# Patient Record
Sex: Female | Born: 1955
Health system: Southern US, Community
[De-identification: ages and names within clinical notes are randomized; demographics above are authoritative.]

## PROBLEM LIST (undated history)

## (undated) DIAGNOSIS — R112 Nausea with vomiting, unspecified: Secondary | ICD-10-CM

## (undated) DIAGNOSIS — F419 Anxiety disorder, unspecified: Secondary | ICD-10-CM

## (undated) DIAGNOSIS — T4145XA Adverse effect of unspecified anesthetic, initial encounter: Secondary | ICD-10-CM

## (undated) DIAGNOSIS — Z9889 Other specified postprocedural states: Secondary | ICD-10-CM

## (undated) DIAGNOSIS — M1990 Unspecified osteoarthritis, unspecified site: Secondary | ICD-10-CM

## (undated) DIAGNOSIS — K219 Gastro-esophageal reflux disease without esophagitis: Secondary | ICD-10-CM

## (undated) DIAGNOSIS — N809 Endometriosis, unspecified: Secondary | ICD-10-CM

## (undated) DIAGNOSIS — T8859XA Other complications of anesthesia, initial encounter: Secondary | ICD-10-CM

## (undated) DIAGNOSIS — I10 Essential (primary) hypertension: Secondary | ICD-10-CM

## (undated) DIAGNOSIS — I447 Left bundle-branch block, unspecified: Secondary | ICD-10-CM

## (undated) DIAGNOSIS — G709 Myoneural disorder, unspecified: Secondary | ICD-10-CM

## (undated) DIAGNOSIS — I498 Other specified cardiac arrhythmias: Secondary | ICD-10-CM

## (undated) HISTORY — DX: Essential (primary) hypertension: I10

## (undated) HISTORY — PX: UPPER GI ENDOSCOPY: SHX6162

## (undated) HISTORY — PX: LAPAROSCOPY: SHX197

## (undated) HISTORY — PX: ERCP: SHX60

## (undated) HISTORY — PX: DILATION AND CURETTAGE OF UTERUS: SHX78

## (undated) HISTORY — DX: Gastro-esophageal reflux disease without esophagitis: K21.9

## (undated) HISTORY — PX: EYE SURGERY: SHX253

## (undated) HISTORY — DX: Endometriosis, unspecified: N80.9

## (undated) HISTORY — PX: COLONOSCOPY: SHX174

---

## 2000-03-22 ENCOUNTER — Other Ambulatory Visit: Admission: RE | Admit: 2000-03-22 | Discharge: 2000-03-22 | Payer: Self-pay | Admitting: Family Medicine

## 2000-06-14 ENCOUNTER — Encounter: Admission: RE | Admit: 2000-06-14 | Discharge: 2000-06-14 | Payer: Self-pay | Admitting: Family Medicine

## 2000-06-14 ENCOUNTER — Encounter: Payer: Self-pay | Admitting: Family Medicine

## 2001-11-17 ENCOUNTER — Other Ambulatory Visit: Admission: RE | Admit: 2001-11-17 | Discharge: 2001-11-17 | Payer: Self-pay | Admitting: Family Medicine

## 2002-09-25 ENCOUNTER — Encounter: Payer: Self-pay | Admitting: Family Medicine

## 2002-09-25 ENCOUNTER — Encounter: Admission: RE | Admit: 2002-09-25 | Discharge: 2002-09-25 | Payer: Self-pay | Admitting: Family Medicine

## 2003-01-14 ENCOUNTER — Other Ambulatory Visit: Admission: RE | Admit: 2003-01-14 | Discharge: 2003-01-14 | Payer: Self-pay | Admitting: Family Medicine

## 2003-12-16 ENCOUNTER — Encounter: Admission: RE | Admit: 2003-12-16 | Discharge: 2003-12-16 | Payer: Self-pay | Admitting: Family Medicine

## 2003-12-22 ENCOUNTER — Encounter: Admission: RE | Admit: 2003-12-22 | Discharge: 2003-12-22 | Payer: Self-pay | Admitting: Family Medicine

## 2004-03-28 ENCOUNTER — Other Ambulatory Visit: Admission: RE | Admit: 2004-03-28 | Discharge: 2004-03-28 | Payer: Self-pay | Admitting: Family Medicine

## 2004-05-12 ENCOUNTER — Ambulatory Visit (HOSPITAL_COMMUNITY): Admission: RE | Admit: 2004-05-12 | Discharge: 2004-05-12 | Payer: Self-pay | Admitting: Gastroenterology

## 2004-05-12 ENCOUNTER — Encounter (INDEPENDENT_AMBULATORY_CARE_PROVIDER_SITE_OTHER): Payer: Self-pay | Admitting: Specialist

## 2004-06-07 ENCOUNTER — Encounter: Admission: RE | Admit: 2004-06-07 | Discharge: 2004-06-07 | Payer: Self-pay | Admitting: Family Medicine

## 2004-06-27 ENCOUNTER — Ambulatory Visit: Payer: Self-pay | Admitting: Gastroenterology

## 2004-06-29 ENCOUNTER — Ambulatory Visit: Payer: Self-pay | Admitting: Gastroenterology

## 2004-07-04 ENCOUNTER — Ambulatory Visit: Payer: Self-pay | Admitting: Gastroenterology

## 2004-07-05 ENCOUNTER — Encounter: Admission: RE | Admit: 2004-07-05 | Discharge: 2004-07-05 | Payer: Self-pay | Admitting: Gastroenterology

## 2004-07-20 ENCOUNTER — Ambulatory Visit (HOSPITAL_COMMUNITY): Admission: RE | Admit: 2004-07-20 | Discharge: 2004-07-20 | Payer: Self-pay | Admitting: Gastroenterology

## 2004-08-21 ENCOUNTER — Encounter: Admission: RE | Admit: 2004-08-21 | Discharge: 2004-08-21 | Payer: Self-pay | Admitting: Surgery

## 2004-09-19 ENCOUNTER — Ambulatory Visit: Payer: Self-pay | Admitting: Internal Medicine

## 2004-09-19 ENCOUNTER — Inpatient Hospital Stay (HOSPITAL_COMMUNITY): Admission: AD | Admit: 2004-09-19 | Discharge: 2004-09-23 | Payer: Self-pay | Admitting: Urology

## 2004-09-22 ENCOUNTER — Encounter (INDEPENDENT_AMBULATORY_CARE_PROVIDER_SITE_OTHER): Payer: Self-pay | Admitting: *Deleted

## 2004-10-06 ENCOUNTER — Ambulatory Visit (HOSPITAL_COMMUNITY): Admission: RE | Admit: 2004-10-06 | Discharge: 2004-10-06 | Payer: Self-pay | Admitting: Internal Medicine

## 2004-10-18 ENCOUNTER — Ambulatory Visit: Payer: Self-pay | Admitting: Internal Medicine

## 2004-10-26 ENCOUNTER — Ambulatory Visit (HOSPITAL_COMMUNITY): Admission: RE | Admit: 2004-10-26 | Discharge: 2004-10-26 | Payer: Self-pay | Admitting: Internal Medicine

## 2004-11-05 ENCOUNTER — Ambulatory Visit: Payer: Self-pay | Admitting: Internal Medicine

## 2004-11-21 ENCOUNTER — Ambulatory Visit: Payer: Self-pay | Admitting: Internal Medicine

## 2005-01-09 ENCOUNTER — Encounter: Admission: RE | Admit: 2005-01-09 | Discharge: 2005-01-09 | Payer: Self-pay | Admitting: Family Medicine

## 2005-03-29 ENCOUNTER — Other Ambulatory Visit: Admission: RE | Admit: 2005-03-29 | Discharge: 2005-03-29 | Payer: Self-pay | Admitting: Family Medicine

## 2006-02-08 ENCOUNTER — Ambulatory Visit (HOSPITAL_COMMUNITY): Admission: RE | Admit: 2006-02-08 | Discharge: 2006-02-08 | Payer: Self-pay | Admitting: Obstetrics and Gynecology

## 2006-03-25 ENCOUNTER — Encounter: Admission: RE | Admit: 2006-03-25 | Discharge: 2006-03-25 | Payer: Self-pay | Admitting: Family Medicine

## 2006-03-27 ENCOUNTER — Encounter: Admission: RE | Admit: 2006-03-27 | Discharge: 2006-03-27 | Payer: Self-pay | Admitting: Family Medicine

## 2006-04-03 ENCOUNTER — Other Ambulatory Visit: Admission: RE | Admit: 2006-04-03 | Discharge: 2006-04-03 | Payer: Self-pay | Admitting: Family Medicine

## 2006-04-19 ENCOUNTER — Encounter: Admission: RE | Admit: 2006-04-19 | Discharge: 2006-04-19 | Payer: Self-pay | Admitting: Family Medicine

## 2006-05-22 ENCOUNTER — Encounter: Admission: RE | Admit: 2006-05-22 | Discharge: 2006-05-22 | Payer: Self-pay | Admitting: Family Medicine

## 2006-09-19 ENCOUNTER — Encounter: Admission: RE | Admit: 2006-09-19 | Discharge: 2006-09-19 | Payer: Self-pay | Admitting: Obstetrics and Gynecology

## 2006-11-25 ENCOUNTER — Encounter: Admission: RE | Admit: 2006-11-25 | Discharge: 2006-11-25 | Payer: Self-pay | Admitting: Obstetrics and Gynecology

## 2006-12-19 ENCOUNTER — Encounter: Admission: RE | Admit: 2006-12-19 | Discharge: 2006-12-19 | Payer: Self-pay | Admitting: Obstetrics and Gynecology

## 2007-01-17 ENCOUNTER — Ambulatory Visit (HOSPITAL_COMMUNITY): Admission: RE | Admit: 2007-01-17 | Discharge: 2007-01-17 | Payer: Self-pay | Admitting: Obstetrics and Gynecology

## 2007-04-18 ENCOUNTER — Other Ambulatory Visit: Admission: RE | Admit: 2007-04-18 | Discharge: 2007-04-18 | Payer: Self-pay | Admitting: Family Medicine

## 2007-05-14 ENCOUNTER — Encounter: Admission: RE | Admit: 2007-05-14 | Discharge: 2007-05-14 | Payer: Self-pay | Admitting: Otolaryngology

## 2007-08-05 ENCOUNTER — Ambulatory Visit (HOSPITAL_COMMUNITY): Admission: RE | Admit: 2007-08-05 | Discharge: 2007-08-05 | Payer: Self-pay | Admitting: Unknown Physician Specialty

## 2008-04-21 ENCOUNTER — Other Ambulatory Visit: Admission: RE | Admit: 2008-04-21 | Discharge: 2008-04-21 | Payer: Self-pay | Admitting: Family Medicine

## 2008-05-05 ENCOUNTER — Encounter: Admission: RE | Admit: 2008-05-05 | Discharge: 2008-05-05 | Payer: Self-pay | Admitting: Family Medicine

## 2008-05-12 ENCOUNTER — Encounter: Admission: RE | Admit: 2008-05-12 | Discharge: 2008-05-12 | Payer: Self-pay | Admitting: Unknown Physician Specialty

## 2008-05-26 ENCOUNTER — Encounter: Admission: RE | Admit: 2008-05-26 | Discharge: 2008-05-26 | Payer: Self-pay | Admitting: Unknown Physician Specialty

## 2008-06-07 ENCOUNTER — Encounter: Admission: RE | Admit: 2008-06-07 | Discharge: 2008-06-07 | Payer: Self-pay | Admitting: Obstetrics and Gynecology

## 2008-07-22 ENCOUNTER — Encounter: Admission: RE | Admit: 2008-07-22 | Discharge: 2008-07-22 | Payer: Self-pay | Admitting: Family Medicine

## 2009-05-23 ENCOUNTER — Other Ambulatory Visit: Admission: RE | Admit: 2009-05-23 | Discharge: 2009-05-23 | Payer: Self-pay | Admitting: Family Medicine

## 2009-09-15 ENCOUNTER — Encounter: Admission: RE | Admit: 2009-09-15 | Discharge: 2009-09-15 | Payer: Self-pay | Admitting: Obstetrics and Gynecology

## 2009-09-21 ENCOUNTER — Encounter: Admission: RE | Admit: 2009-09-21 | Discharge: 2009-09-21 | Payer: Self-pay | Admitting: Obstetrics and Gynecology

## 2010-02-05 ENCOUNTER — Encounter: Payer: Self-pay | Admitting: Gastroenterology

## 2010-02-05 ENCOUNTER — Encounter: Payer: Self-pay | Admitting: Obstetrics and Gynecology

## 2010-02-05 ENCOUNTER — Encounter: Payer: Self-pay | Admitting: Family Medicine

## 2010-06-02 NOTE — Op Note (Signed)
NAMEZAYLEY, Amanda Bentley            ACCOUNT NO.:  [id number]   MEDICAL RECORD NO.:  [id number]          PATIENT TYPE:  AMB   LOCATION:  ENDO                         FACILITY:  [facility]   PHYSICIAN:  Graylin Shiver, M.D.   DATE OF BIRTH:  June 25, 1955   DATE OF PROCEDURE:  05/12/2004  DATE OF DISCHARGE:                                 OPERATIVE REPORT   PROCEDURE:  Esophagogastroduodenoscopy with biopsy.   INDICATIONS FOR PROCEDURE:  A 55 year old female with complaints of burning  in her throat and chest over the past month. Endoscopy is being done to  evaluate.   Informed consent was obtained after explanation of the risks of bleeding,  infection and perforation.   PREMEDICATION:  Fentanyl 75 mcg IV, Versed 7 mg IV.   DESCRIPTION OF PROCEDURE:  With the patient in the left lateral decubitus  position, the Olympus gastroscope was inserted into the oropharynx and  passed into the esophagus. It was advanced down the esophagus and into the  stomach and into the duodenum. The second portion and bulb of the duodenum  looked normal. The stomach showed a diffuse erythematous appearance to the  mucosa compatible with gastritis. Biopsies were obtained for histology and  to look for evidence of Helicobacter pylori. In the lower body of the  stomach on the greater curvature side, there was a small 4 mm polyp that was  biopsied off with cold forceps. No specific lesions were seen in the fundus  or cardia. The esophagus revealed the esophagogastric junction to be at 40  cm, it looked normal. The esophageal mucosa looked normal in its entirety.  She tolerated the procedure well without complications.   IMPRESSION:  1.  Gastritis.  2.  Small gastric polyp.   PLAN:  The biopsies will be checked. The patient will remain on a proton  pump inhibitor.      SFG/MEDQ  D:  05/12/2004  T:  05/12/2004  Job:  [id number]   cc:   Juluis Mire, M.D.  [address] [place]  [place] [address]  Fax: [phone]   Quita Skye. Artis Flock, M.D.  [address], [address]  [place]  [place] [address]  Fax: [phone]

## 2010-06-02 NOTE — H&P (Signed)
NAMEJORDANN, Amanda Bentley            ACCOUNT NO.:  [id number]   MEDICAL RECORD NO.:  [id number]          PATIENT TYPE:  INP   LOCATION:  6715                         FACILITY:  [facility]   PHYSICIAN:  Jamison Neighbor, M.D.  DATE OF BIRTH:  Mar 24, 1955   DATE OF ADMISSION:  09/19/2004  DATE OF DISCHARGE:                                HISTORY & PHYSICAL   ADMITTING DIAGNOSIS:  Abdominal pain of undetermined etiology.   HISTORY:  This is a 55 year old female who has severe upper gastrointestinal  abdominal pain that has been unresponsive to conventional medical therapy.  The patient was first seen and evaluated by Dr. Evette Cristal from [facility], who saw  her in April 2006.  He obtained a CT scan of the abdomen that was felt to be  unremarkable.  It should be noted this was done without IV contrast due to  allergies.  There was some question about a possibly enlarged liver some  gallbladder sludge.  Endoscopy was performed on May 04, 2004.  There were  gastric biopsies showed no evidence of H. pylori.  There was a question of a  small gastric polyp, and some question of gastritis.  The patient did have a  proton pump inhibitor started.  The patient did not improve, and Dr. Evette Cristal  felt there was no much more that he could do.  The patient has been seen by  Dr. Cicero Duck, who ordered a hepatobiliary scan, which showed normal  gallbladder ejection fraction.  He did, at one point, offer to do a  cholecystectomy with no guarantee that there would be any improvement.  The  patient did not wish to have this done.  She was subsequently seen by Dr.  Sheryn Bison as a self referral.  At that point, the patient noted that  her pain had been going on for many months.  There has also been some  associated anxiety and panic attacks that were worsened by the death of her  sister over 9 months ago from gastric carcinoma.  The patient has had  burning in the retropharyngeal area with crampy lower abdominal  pain and  diarrhea.  She has not tolerated proton pump inhibitors, but states they  have not worked, and she now has a cough and burning pain in her chest that  radiates into both shoulders.  The patient has had significant weight loss;  she now says almost 40 pounds.  The patient has had regular bowel movements.  She has had no blood per rectum.  She has had no change in the color of her  stools.  There has been no evidence of jaundice.  The patient says that she  does feel like her food is not being absorbed well.  She says she is eating,  but at other points she does note that she has significant issues with  nausea.   PAST MEDICAL HISTORY:  1.  Remarkable for hypertension.  2.  She has had a laparoscopy in 2000, and has been treated in the past for      endometriosis.   MEDICATIONS AT THE TIME OF  ADMISSION:  1.  Clonazepam 0.5 mg b.i.d. which was started by Dr. Jarold Motto.  2.  AcipHex 20 mg daily, which is the only medication for reflux that she      seems to tolerate.  3.  Birth control pills.  4.  Potassium that she took because she did have a slightly low potassium in      the past.  5.  5-HTP dietary supplement.   ALLERGIES:  1.  The patient is known to have a history of IVP dye allergy.  2.  As noted above, has had problems with different proton pump inhibitors.   FAMILY HISTORY:  Remarkable for a sister who died of stomach cancer.  Father  had alcoholism.  Her mother died of lung cancer.   SOCIAL HISTORY:  Unremarkable.  She does not use tobacco or alcohol.  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   REVIEW OF SYSTEMS:  Unremarkable, with the exception of the fact that the  patient does have occasional palpitations.  In the past, she has had a  diagnosis of Wolff-Parkinson-White, but is not seen by a cardiologist.  The  patient had no other ongoing cardiac, pulmonary, musculoskeletal,  neurologic, endocrine, vascular,  gastrointestinal, dermatologic, urologic,  gynecologic, or psychologic problems, other than as described above.   PHYSICAL EXAMINATION:  GENERAL:  She is a well-developed, well-nourished  female.  She is slightly anxious, and has a great deal of upper abdominal  distress.  VITAL SIGNS:  Temperature is 98.4, pulse 70, respirations 17, blood pressure  128/85.  HEENT:  Normocephalic and atraumatic.  Cranial nerves II-XII appeared  grossly intact.  NECK:  Supple.  No adenopathy or thyromegaly.  LUNGS:  Clear.  HEART:  Regular rate and rhythm with no murmurs, thrills, gallops, rubs, or  heaves.  ABDOMEN:  Soft with no hepatosplenomegaly.  Her tenderness was all across  the upper abdomen.  It does seem to reflux up into the epigastric area.  Bowel sounds are normal.  The peripheral pulses are unremarkable.  NEUROLOGIC:  Appear to be intact.  PELVIC:  The patient did not undergo a pelvic examination by me, as she has  had this done fairly recently by Dr. Arelia Sneddon.  She has also had a recent  possible sigmoidoscopy and Hemoccult  by Dr. Luanna Salk.   I am concerned that this patient has had very extensive GI evaluation so  far, but it has been generally negative.  She is very concerned about the  fact that she did have a slight increase in her liver function.  She is  losing weight at a precipitous rate.  She is highly anxious, and has not  responded to standard forms of antidepressants, such as Paxil.  I think that  she needs to be admitted for a complete evaluation to determine once and for  all if there is anything significant that can help this patient.  I have  recommended that she be brought into the hospital and undergo evaluation by  both GI and general surgery, and will keep her in the hospital until that  evaluation can be completed.  Studies that I think will need to be  considered by the GI consultants will include a repeat ultrasound, possible hepatobiliary scan, possible CT scan,  as well as repeat of her upper GI  endoscopy.  Also, if there is any suggestion that she may really indeed have  reflux, manometry would be worthwhile.  At this  point, I am not clear if she  has any real lower GI symptoms, but if there is evidence, she should  probably undergo a colonoscopy, as she is now 55 years old and has never  undergone a procedure of that nature.  This would essentially rule out lower  GI condition such as Crohn's disease or ulcerative colitis.  I also believe  the patient should undergo screening for celiac sprue.  I have told the  patient that if her GI evaluation is completely negative, and both general  surgery and GI do not feel that there is any value to any additional  testing, I would recommend  that she undergo counseling for anxiety to  determine if anything could be done to help her function a little better.  Dietary consultation will be sought during this hospital stay.           ______________________________  Jamison Neighbor, M.D.  Electronically Signed     RJE/MEDQ  D:  09/20/2004  T:  09/21/2004  Job:  [id number]

## 2010-06-02 NOTE — Consult Note (Signed)
Amanda Bentley, Amanda Bentley            ACCOUNT NO.:  [id number]   MEDICAL RECORD NO.:  [id number]          PATIENT TYPE:  INP   LOCATION:  6715                         FACILITY:  [facility]   PHYSICIAN:  Iva Boop, M.D. LHCDATE OF BIRTH:  Dec 04, 1955   DATE OF CONSULTATION:  09/20/2004  DATE OF DISCHARGE:                                   CONSULTATION   REQUESTING PHYSICIAN:  Dr. Marcelyn Bruins   REASON FOR CONSULTATION:  Abdominal pain, chest pain.   ASSESSMENT:  Chronic chest burning symptoms associated with some variable  dysphagia and a globus sensation in the past. She now has abdominal pain in  multiple sites but is tender in the epigastrium. There is a 35-pound weight  loss. Multiple studies have been negative for a gastrointestinal etiology  and for any source of her pain. This is in the setting of her sister dying  from gastric cancer about 8 months ago. These problems started after an  acute onset of what sounds like epigastric and periumbilical abdominal pain  that she says was like a stomach bug. Then things seem to spread up into  her chest with burning that is persistent. She has been to ENT, she has been  to two different gastroenterologists Evette Cristal and Jarold Motto), she has had an  endoscopy, she has had CT of the chest, CT of the abdomen and pelvis, pH  probe - all of which have been unrevealing. Gallbladder HIDA scan with  ejection fraction and ultrasound have been unrevealing except for sludge in  the gallbladder. She has had some minor transaminase elevation without  etiology. Her sed rate and her C reactive protein have been normal. She has  not been anemic. She has green stools but no real significant change in her  bowel habits. She does have some panic-attack-like symptoms in this period  of time since this started in April. She has not really responded to proton  pump inhibitors but has had some benefit on AcipHex. However, she indicates  that AcipHex and Nexium have  given her headaches and multiple joint pains  and somatic complaints and perhaps even some paresthesias, though that is  not entirely clear. She does not appear overtly depressed. She has been  using some Klonopin, which helps at times. In May 2006, she did have an  enlarged liver seen on an unenhanced CT scan.   DISCUSSION:  I think she could potentially have had some type of neuropathic  problem triggered by gastroenteritis, though I doubt it. Sprue is possible  with weight loss, arthralgias, and abnormal LFTs. Given her headaches and  multiple somatic complaints, multiple sclerosis comes to mind as a  possibility; though that is pretty unlikely, I think it is quite possible. A  somatization problem related to mental illness is possible. I could not  palpate her liver on exam, though I think that it is certainly possible she  could have hepatomegaly problems based upon the CT, and this should be  followed up on.   PLAN:  1.  Check barium swallow.  2.  Check Sprue antibodies. A Sprue panel will be checked.  3.  Consider a low-dose tricyclic agent though she is reluctant, pending      this workup.  4.  I think an MRI of the brain would be reasonable.  5.  MRI of the liver versus CT of the abdomen and pelvis with IV contrast. I      do not think she has a contrast allergy, but we will check. Note that      ultrasound on June 30, 2004 did not show any problems as far as      gallstones, and the liver looked normal without hepatomegaly.   Upon further review plan changed to CT abd/pelvis with subsequent EGD.   HISTORY:  Pretty much as above. This lady has had significant problems with  chest burning. Everything she eats burns, but she burns all the time from  morning to night. She will occasionally use Klonopin at night. She is having  some epigastric pain and tenderness there. Bowel movements are green. She  has multiple joint pains and aches and burning in parts of her body at   times, it sounds like, but she attributes this to either AcipHex or Nexium.  The AcipHex helps her symptoms about 50%. She is eating less because of her  problems. She does not really have nausea and vomiting and there is no GI  bleeding. She has had numerous studies as outlined above, including the  abdominal ultrasound, CT of the chest (without contrast), HIDA scan, acute  abdominal series, CT of the abdomen without contrast, and the EGD by Dr.  Evette Cristal in April that showed gastritis, CLO biopsy negative, and a small  benign gastric polyp. There were reactive changes, question related to  NSAIDs versus alcohol, though I do not think there is any significant  alcohol history. She has had some intermittent mild abnormalities of her  LFTs with SGPT of 74 this admission, at times they have been higher, and she  had SGOT of 46, SGPT of 131 on July 04, 2004 when Dr. Jarold Motto saw her. He  had requested she do some other studies but I do not think those were ever  really done. She has had a negative sed rate and C reactive protein. She has  had a lot of globus problems and vague dysphagia. She does have panic  symptoms at times with tachycardia and chest pain. She took some Effexor and  said she had terrible chest pain that awakened her from sleep and she had to  go to the fire station for help, and was hypertensive - all after one dose.   PAST MEDICAL HISTORY:  1.  As above.  2.  Hypertension. Has been off her blood pressure medicines.  3.  Laparoscopic surgery for endometriosis in the past.   MEDICATIONS ON ADMISSION:  Percocet, Phenergan, Ambien, Xanax - all p.r.n.   SOCIAL HISTORY:  High-school education. She works at the [employer]. No tobacco or alcohol. She is married.   FAMILY HISTORY:  As above, as far as sister dying with gastric cancer.  REVIEW OF SYSTEMS:  No urinary symptoms, no bleeding problems. She has had  some loss of concentration. She has not had  hypersomnolence or significant  insomnia as far as I can tell. She has clearly been preoccupied with these  problems. All other systems appear negative other than those things  mentioned above.   PHYSICAL EXAMINATION:  GENERAL:  Reveals a pleasant, middle-aged white woman  in no acute distress.  VITAL SIGNS:  Show pulse 80 and regular, temperature 98.1, respirations 18,  blood pressure 144/82, weight 143 pounds.  HEENT:  Eyes anicteric. Mouth and posterior pharynx free of lesions. Cranial  nerves II-XII are intact.  NECK:  Supple. No thyromegaly or mass, no lymphadenopathy of the neck or  supraclavicular regions or inguinal regions.  CHEST:  Clear.  HEART:  S1, S2. No murmurs, or gallops. Chest wall nontender.  ABDOMEN:  There is mild epigastric tenderness that seems improved with  muscle wall tension. There is no organomegaly or mass or tenderness in the  abdomen.  EXTREMITIES:  No edema in the extremities.  SKIN:  There does not appear to be skin rash.  MENTAL STATUS:  She is appropriate, perhaps slightly anxious, but has as  normal affect.  NEUROLOGIC:  No tremor, otherwise nonfocal.   LABORATORY DATA:  As above as far as LFTs, they are otherwise normal.  Albumin 3.8 which normal. BMET normal. White count 10.4, hemoglobin 13.6,  MCV 91, platelets 298. Amylase 62.   I appreciate the opportunity to care for this patient.      Iva Boop, M.D. [facility]  Electronically Signed     CEG/MEDQ  D:  09/20/2004  T:  09/21/2004  Job:  [id number]   cc:   Nicholes Rough, [place] Scheryl Darter, PA-C   Currie Paris, M.D.  [address], [address]  [place]  [place] [address]

## 2010-06-02 NOTE — Consult Note (Signed)
NAMEERLA, BACCHI            ACCOUNT NO.:  [id number]   MEDICAL RECORD NO.:  [id number]          PATIENT TYPE:  INP   LOCATION:  6715                         FACILITY:  [facility]   PHYSICIAN:  Guy Franco, P.A.       DATE OF BIRTH:  Jan 05, 1956   DATE OF CONSULTATION:  09/21/2004  DATE OF DISCHARGE:                                   CONSULTATION   REASON FOR CONSULTATION:  Abdominal pain and weight loss.   HISTORY OF PRESENT ILLNESS:  Ms. Cronce is a 55 year old female who  complains of a 35 pound weight loss since March 2006.  She has also had  multiple GI symptoms including reflux, chest burning, throat burning.  She  has tried multiple PPIs and states that AcipHex has helped the most.  Her  abdominal pain, when asking her to describe the location, seems to be more  epigastric in nature.  She did undergo an EGD in April 2006 by Dr. Evette Cristal and  this showed gastritis as well as a small gastric polyp which was apparently  benign.  A CT of the abdomen in May 2006 was not acute.  There was a  question of sludge in the gallbladder.  A follow up abdominal ultrasound was  normal.  A HIDA scan was normal.  The other complaint that she has is green  stool.  She has apparently seen Dr. Jamey Ripa in our office and essentially  wants her gallbladder out but he has explained to her that this may not take  care of all her symptoms.  Her other complaint is that she feels like food  gets stuck in her throat.  Labs today show SGPT 74.  Amylase, urinalysis,  CBC, and CMP are normal.  Abdominal films of the abdomen are normal.   PAST MEDICAL HISTORY:  Hypertension, anxiety, history of endometriosis  (diagnosed laparoscopically), and depression.   SOCIAL HISTORY:  She works at [employer] and lives in [place], no  tobacco or alcohol use.   FAMILY HISTORY:  Her sister died of stomach cancer approximately one year  ago.  Her mom died at 71 of  lung disease.  Father with a history of alcohol   use.   ALLERGIES:  IV dye.   MEDICATIONS:  Klonopin, Protonix, Ambien, Percocet, Phenergan, Xanax.   REVIEW OF SYSTEMS:  As above, otherwise, negative.   PHYSICAL EXAMINATION:  VITAL SIGNS:  Temperature 98.8, blood pressure 121/80, pulse 75,  respirations 20, O2 saturation 99% on room air.  HEENT:  Grossly normal.  Sclerae clear, conjunctivae normal, nares without  drainage.  NECK:  No carotid or subclavian bruits, no JVD, no thyromegaly.  CHEST:  Clear to auscultation bilaterally, no wheezing or rhonchi.  HEART:  Regular rate and rhythm, no murmurs, gallops, and rubs.  ABDOMEN:  Epigastric tenderness but good bowel sounds, no rigidity.  EXTREMITIES:  Lower extremities with no peripheral edema, warm, and well  perfused.  NEUROLOGICAL:  She did have a flat affect, but otherwise, normal.  SKIN:  Warm and dry.   ASSESSMENT AND PLAN:  1.  Abdominal pain.  2.  Reflux symptoms.  3.  Weight loss/anorexia.  4.  Anxiety/depression.  5.  Hypertension.  6.  Dysphagia.   At this point, we will obtain stool studies and will continue to follow the  patient along with the GI service.  Dr. Carolynne Edouard did see the patient and  examined her and felt like she may benefit from a manometry study.  We will  continue to follow the patient.      Guy Franco, P.A.     LB/MEDQ  D:  09/21/2004  T:  09/21/2004  Job:  [id number]   cc:   Iva Boop, M.D. [address] [facility]  [address] [place], [place] [address]   Jamison Neighbor, M.D.  [address]  [place]  [place] [address]  Fax: [phone]

## 2010-06-02 NOTE — Discharge Summary (Signed)
Amanda Bentley, Amanda Bentley            ACCOUNT NO.:  [id number]   MEDICAL RECORD NO.:  [id number]          PATIENT TYPE:  INP   LOCATION:  6715                         FACILITY:  [facility]   PHYSICIAN:  Jamison Neighbor, M.D.  DATE OF BIRTH:  [birth year]   DATE OF ADMISSION:  09/19/2004  DATE OF DISCHARGE:  09/23/2004                                 DISCHARGE SUMMARY   ADMISSION DIAGNOSIS:  Abdominal pain of undermined etiology.   DISCHARGE DIAGNOSIS:  Abdominal pain of undermined etiology.   HISTORY:  This 55 year old female developed severe gastrointestinal upper  abdominal pain and was unresponsive to conventional medical therapy.  The  patient was first seen by Dr. Evette Cristal from [facility] who obtained a CT scan  that was felt to be unremarkable.  This was done without IV contrast because  the patient had allergy.  There was a question of possible gallbladder  sludge.  She underwent endoscopy and gastric biopsy showed no evidence of H.  pylori.  There was a question of possible gastritis.  The patient was  started on a proton pump inhibitor and she did not improve.  She was seen by  Dr. Currie Paris, who ordered hepatobiliary scan which showed normal  gallbladder ejection.  He did offer to do a cholecystectomy without any  guarantee that this would help.  The patient was then self-referred to Amanda Bentley. Jarold Motto, M.D.  The patient had stated that her pain had been going on  for many months.  There has also been anxiety and panic attacks that are  worsened by the death of her sister nine months ago from gastric carcinoma.  The patient certainly was told that this may be in some way a relationship  to her sister's death but she really felt that there was something truly  physically wrong with her stomach and it was felt that additional evaluation  would come up with a reason.  The patient says that she has burning in the  retropharyngeal area with crampy lower abdominal pain and diarrhea.   She  cannot tolerate proton pump inhibitors.  She also states that she has cough  and burning pain in her chest and has had a 40 pound weight loss.  She says  the bowel movements have been normal.  She has had no blood per rectum.  There has been no change in the color of her stools.  She has had no  evidence of jaundice. She has a sensation that food is not being well  absorbed.  The patient still has issues and problems with nausea.   The patient has a past history of hypertension.  She had a laparoscopy in  2000 for endometriosis.  At the time of admission the patient was on  Klonopin, AcipHex, oral contraceptives, potassium and dietary supplements.   She is known to have IVP DYE allergy and sensitivity to PROTON PUMP  INHIBITORS.   FAMILY HISTORY:  Pertinent for sister who died of stomach cancer.  Father  died of alcoholism.  Mother died of lung cancer.   SOCIAL HISTORY:  Unremarkable.  She does  not use tobacco or alcohol.   PHYSICAL EXAMINATION:  The physical examination is well described in the  initial history and physical.  Aside from some diffuse tenderness across the  upper abdomen, examination was normal.   The patient was admitted to the hospital to try and evaluate further her  problems. She was noted at the time of her admission to be extremely  anxious, teary, trembling and having significant anxiety attacks.  We  arranged for a GI consultation and general surgery consultation.  The GI  people saw her for a third admission and they noted that the only medication  that had given the patient any improvement was the AcipHex.  They thought  perhaps she had esophageal spasms and suggested that some additional  information might be obtained from a barium swallow.  General surgery also  saw the patient and recommended that stool culture for O&P be obtained as  well.  A nutrition consult was sought because the patient had significant  weight loss.   LABORATORY DATA:  Normal  urinalysis. Metabolic panel was normal except for  slightly elevated SGPT.  CBC was normal.  Abdominal structured series was  normal.   The patient was given Benadryl and Solu-Cortef that she could undergo a CT  scan with contrast.  The CT scan was negative.   EGD results showed minimal gastritis.  They made plans for her to be treated  with the GI cocktail.   We did make a decision to put the patient on Bentyl for spasms and Elavil to  try and relieve some anxiety and hopefully cut down on the pain.   The patient was ready for discharge by September 23, 2004.  Dr. Leone Payor felt  the patient might respond to the GI cocktails and did think a manometry may  be necessary, also was going to arrange for outpatient colonoscopy.           ______________________________  Jamison Neighbor, M.D.  Electronically Signed     RJE/MEDQ  D:  02/14/2005  T:  02/14/2005  Job:  [id number]   cc:   Iva Boop, M.D. [facility]  [address] [place], [place] [address]

## 2010-06-02 NOTE — Op Note (Signed)
Amanda Bentley, BUIKEMA            ACCOUNT NO.:  [id number]   MEDICAL RECORD NO.:  [id number]          PATIENT TYPE:  AMB   LOCATION:  SDC                           FACILITY:  [facility]   PHYSICIAN:  Michelle L. Grewal, M.D.DATE OF BIRTH:  [birth year]   DATE OF PROCEDURE:  02/08/2006  DATE OF DISCHARGE:                               OPERATIVE REPORT   PREOPERATIVE DIAGNOSIS:  Pelvic pain and history of endometriosis.   POSTOPERATIVE DIAGNOSIS:  Pelvic pain and history of endometriosis.   PROCEDURE:  Diagnostic laparoscopy.   SURGEON:  Michelle L. Vincente Poli, M.D.   ANESTHESIA:  General.   ESTIMATED BLOOD LOSS:  Minimal.   COMPLICATIONS:  None.   PROCEDURE:  Patient was taken to the operating room.  She is intubated  without difficulty.  She is then prepped and draped in the usual sterile  fashion.  An in-and-out catheter is used to empty the bladder.  A  speculum is inserted into the vagina.  The cervix is grasped with a  tenaculum.  A uterine manipulator was inserted.  Attention was then  turned to the abdomen.  A small infraumbilical incision was made, and a  Veress needle was inserted through that incision.  Pneumoperitoneum was  performed.  The Veress needle was removed, and the 11 mm trocar was  inserted.  The laparoscope was introduced through the trocar.  The  patient was placed in Trendelenburg.  A 5 mm trocar was placed  suprapubically.  Exam of the abdomen and pelvis revealed the following:  The uterus was normal sized, mobile.  No endometriotic implants were  noted.  It was mid position.  No fibroids were seen.  The fallopian  tubes were normal.  Ovaries were very normal, in their normal location  with no adhesions or endometriosis, they looked very healthy.  The cul  de sac was clean, and no adhesions or endometriosis were seen.  The  appendix was visualized with normal location.  Appeared to be normal  length with no adhesions.  The liver surface, there appeared to be a  little bit of fatty liver, otherwise unremarkable.  No adhesions were  seen in the right upper quadrant.  The omentum was normal.  No abdominal  adhesions were noted.  The surfaces over the cecum, there a small amount  of exudate over the cecal area, and I do not know if this was just some  inflammatory changes, but the cecum itself appeared normal and did not  appear to be red.  The only thing that I saw by exam that could possibly  be the cause of some of her pain could be possibly the cecum, possibly  inflammatory bowel disease or irritable bowel.  We will discuss with the  patient and her husband postop about a GI consultation.  Otherwise, the  pelvis and abdomen were clean.  Pneumoperitoneum was then released.  The trocars were removed.  The  incisions were closed with Dermabond skin adhesive.  All sponge, lap,  and instrument counts were correct x2.  Patient went to the regular  rhythm in stable condition.  Michelle L. Vincente Poli, M.D.  Electronically Signed     MLG/MEDQ  D:  02/08/2006  T:  02/08/2006  Job:  [id number]

## 2010-06-02 NOTE — Op Note (Signed)
Amanda Bentley, Amanda Bentley            ACCOUNT NO.:  [id number]   MEDICAL RECORD NO.:  [id number]          PATIENT TYPE:  AMB   LOCATION:  ENDO                         FACILITY:  [facility]   PHYSICIAN:  Iva Boop, M.D. LHCDATE OF BIRTH:  Apr 17, 1955   DATE OF PROCEDURE:  10/26/2004  DATE OF DISCHARGE:  10/26/2004                                 OPERATIVE REPORT   OPERATION/PROCEDURE:  Esophageal manometry.   INDICATIONS:  Chest pain, heartburn, abdominal motility suspected at barium  swallow.   FINDINGS:  The tracings were reviewed.  The procedure was performed by  endoscopy lab staff and my review indicates that this is adequate for  interpretation.   ASSESSMENT:  1.  Lower esophageal sphincter pressure is low at 3.5 mmHg.  2.  Peristalsis is normal with good contractility of the esophagus.  3.  There is some low amplitude at times but overall things look normal with      normal velocity of swallows as well.  4.  Lower esophageal sphincter relaxation is normal.  5.  Lower esophageal sphincter is approximately 5.5 cm in length with      proximal margin 41.5 cm, distal margin measures 47 cm.  6.  Upper esophageal study shows the upper esophageal sphincter relaxes.      There is some high residual pressure.   OVERALL IMPRESSION:  Other than some mildly low lower esophageal sphincter  pressure, I think peristalsis is normal and overall this is a normal  esophageal manometry study.  I do not think her symptoms are related to  esophageal dysmotility necessarily.  I am not sure why the poor motility was  noted at barium swallow.   PLAN:  Clinical followup at this point.  I will review her chart and make  further recommendations.      Iva Boop, M.D. [facility]  Electronically Signed     CEG/MEDQ  D:  11/05/2004  T:  11/06/2004  Job:  [id number]

## 2010-06-09 ENCOUNTER — Other Ambulatory Visit (HOSPITAL_COMMUNITY)
Admission: RE | Admit: 2010-06-09 | Discharge: 2010-06-09 | Disposition: A | Discharge status: Home / Self Care | Payer: BC Managed Care – PPO | Source: Ambulatory Visit | Attending: Family Medicine | Admitting: Family Medicine

## 2010-06-09 ENCOUNTER — Other Ambulatory Visit: Payer: Self-pay | Admitting: Family Medicine

## 2010-06-09 ENCOUNTER — Other Ambulatory Visit: Payer: Self-pay | Admitting: Physician Assistant

## 2010-06-09 DIAGNOSIS — Z0000 Encounter for general adult medical examination without abnormal findings: Secondary | ICD-10-CM | POA: Insufficient documentation

## 2010-06-09 DIAGNOSIS — N6320 Unspecified lump in the left breast, unspecified quadrant: Secondary | ICD-10-CM

## 2010-06-15 ENCOUNTER — Ambulatory Visit
Admission: RE | Admit: 2010-06-15 | Discharge: 2010-06-15 | Disposition: A | Discharge status: Home / Self Care | Payer: BC Managed Care – PPO | Source: Ambulatory Visit | Attending: Family Medicine | Admitting: Family Medicine

## 2010-06-15 DIAGNOSIS — N6320 Unspecified lump in the left breast, unspecified quadrant: Secondary | ICD-10-CM

## 2010-10-06 ENCOUNTER — Other Ambulatory Visit: Payer: Self-pay | Admitting: Family Medicine

## 2010-10-06 DIAGNOSIS — Z1231 Encounter for screening mammogram for malignant neoplasm of breast: Secondary | ICD-10-CM

## 2010-10-10 ENCOUNTER — Ambulatory Visit
Admission: RE | Admit: 2010-10-10 | Discharge: 2010-10-10 | Disposition: A | Discharge status: Home / Self Care | Payer: BC Managed Care – PPO | Source: Ambulatory Visit | Attending: Family Medicine | Admitting: Family Medicine

## 2010-10-10 DIAGNOSIS — Z1231 Encounter for screening mammogram for malignant neoplasm of breast: Secondary | ICD-10-CM

## 2010-10-18 ENCOUNTER — Other Ambulatory Visit: Payer: Self-pay | Admitting: Family Medicine

## 2010-10-18 DIAGNOSIS — R928 Other abnormal and inconclusive findings on diagnostic imaging of breast: Secondary | ICD-10-CM

## 2010-10-25 ENCOUNTER — Ambulatory Visit
Admission: RE | Admit: 2010-10-25 | Discharge: 2010-10-25 | Disposition: A | Discharge status: Home / Self Care | Payer: BC Managed Care – PPO | Source: Ambulatory Visit | Attending: Family Medicine | Admitting: Family Medicine

## 2010-10-25 DIAGNOSIS — R928 Other abnormal and inconclusive findings on diagnostic imaging of breast: Secondary | ICD-10-CM

## 2010-10-30 ENCOUNTER — Other Ambulatory Visit: Payer: BC Managed Care – PPO

## 2011-03-26 ENCOUNTER — Other Ambulatory Visit: Payer: Self-pay | Admitting: Family Medicine

## 2011-03-26 DIAGNOSIS — R922 Inconclusive mammogram: Secondary | ICD-10-CM

## 2011-03-26 DIAGNOSIS — R9230 Dense breasts, unspecified: Secondary | ICD-10-CM

## 2011-04-18 ENCOUNTER — Ambulatory Visit
Admission: RE | Admit: 2011-04-18 | Discharge: 2011-04-18 | Disposition: A | Discharge status: Home / Self Care | Payer: BC Managed Care – PPO | Source: Ambulatory Visit | Attending: Family Medicine | Admitting: Family Medicine

## 2011-04-18 DIAGNOSIS — R922 Inconclusive mammogram: Secondary | ICD-10-CM

## 2011-07-27 ENCOUNTER — Other Ambulatory Visit: Payer: Self-pay | Admitting: Physician Assistant

## 2011-07-27 ENCOUNTER — Other Ambulatory Visit (HOSPITAL_COMMUNITY)
Admission: RE | Admit: 2011-07-27 | Discharge: 2011-07-27 | Disposition: A | Discharge status: Home / Self Care | Payer: BC Managed Care – PPO | Source: Ambulatory Visit | Attending: Family Medicine | Admitting: Family Medicine

## 2011-07-27 DIAGNOSIS — Z0000 Encounter for general adult medical examination without abnormal findings: Secondary | ICD-10-CM | POA: Insufficient documentation

## 2011-08-01 ENCOUNTER — Other Ambulatory Visit: Payer: Self-pay | Admitting: Family Medicine

## 2011-08-01 DIAGNOSIS — IMO0002 Reserved for concepts with insufficient information to code with codable children: Secondary | ICD-10-CM

## 2011-08-02 ENCOUNTER — Other Ambulatory Visit: Payer: BC Managed Care – PPO

## 2011-10-04 ENCOUNTER — Other Ambulatory Visit: Payer: Self-pay | Admitting: Family Medicine

## 2011-10-04 DIAGNOSIS — Z1231 Encounter for screening mammogram for malignant neoplasm of breast: Secondary | ICD-10-CM

## 2011-10-22 ENCOUNTER — Ambulatory Visit: Payer: BC Managed Care – PPO

## 2011-10-24 ENCOUNTER — Ambulatory Visit
Admission: RE | Admit: 2011-10-24 | Discharge: 2011-10-24 | Disposition: A | Discharge status: Home / Self Care | Payer: BC Managed Care – PPO | Source: Ambulatory Visit | Attending: Family Medicine | Admitting: Family Medicine

## 2011-10-24 DIAGNOSIS — Z1231 Encounter for screening mammogram for malignant neoplasm of breast: Secondary | ICD-10-CM

## 2012-07-30 ENCOUNTER — Other Ambulatory Visit: Payer: Self-pay | Admitting: Physician Assistant

## 2012-07-30 ENCOUNTER — Other Ambulatory Visit (HOSPITAL_COMMUNITY)
Admission: RE | Admit: 2012-07-30 | Discharge: 2012-07-30 | Disposition: A | Discharge status: Home / Self Care | Payer: BC Managed Care – PPO | Source: Ambulatory Visit | Attending: Family Medicine | Admitting: Family Medicine

## 2012-07-30 DIAGNOSIS — Z0000 Encounter for general adult medical examination without abnormal findings: Secondary | ICD-10-CM | POA: Insufficient documentation

## 2012-10-23 ENCOUNTER — Other Ambulatory Visit: Payer: Self-pay

## 2012-10-23 DIAGNOSIS — Z1231 Encounter for screening mammogram for malignant neoplasm of breast: Secondary | ICD-10-CM

## 2012-10-31 ENCOUNTER — Ambulatory Visit
Admission: RE | Admit: 2012-10-31 | Discharge: 2012-10-31 | Disposition: A | Discharge status: Home / Self Care | Payer: BC Managed Care – PPO | Source: Ambulatory Visit

## 2012-10-31 DIAGNOSIS — Z1231 Encounter for screening mammogram for malignant neoplasm of breast: Secondary | ICD-10-CM

## 2012-11-10 ENCOUNTER — Other Ambulatory Visit: Payer: Self-pay | Admitting: Family Medicine

## 2012-11-10 DIAGNOSIS — R928 Other abnormal and inconclusive findings on diagnostic imaging of breast: Secondary | ICD-10-CM

## 2012-11-11 ENCOUNTER — Other Ambulatory Visit: Payer: BC Managed Care – PPO

## 2012-11-21 ENCOUNTER — Ambulatory Visit
Admission: RE | Admit: 2012-11-21 | Discharge: 2012-11-21 | Disposition: A | Discharge status: Home / Self Care | Payer: BC Managed Care – PPO | Source: Ambulatory Visit | Attending: Family Medicine | Admitting: Family Medicine

## 2012-11-21 DIAGNOSIS — R928 Other abnormal and inconclusive findings on diagnostic imaging of breast: Secondary | ICD-10-CM

## 2012-11-25 ENCOUNTER — Other Ambulatory Visit: Payer: BC Managed Care – PPO

## 2012-12-01 ENCOUNTER — Ambulatory Visit (INDEPENDENT_AMBULATORY_CARE_PROVIDER_SITE_OTHER): Payer: BC Managed Care – PPO | Admitting: General Surgery

## 2012-12-01 ENCOUNTER — Encounter (INDEPENDENT_AMBULATORY_CARE_PROVIDER_SITE_OTHER): Payer: Self-pay | Admitting: General Surgery

## 2012-12-01 VITALS — BP 130/78 | HR 68 | Temp 98.6°F | Resp 14 | Ht 67.0 in | Wt 212.4 lb

## 2012-12-01 DIAGNOSIS — N63 Unspecified lump in unspecified breast: Secondary | ICD-10-CM

## 2012-12-01 DIAGNOSIS — N6310 Unspecified lump in the right breast, unspecified quadrant: Secondary | ICD-10-CM | POA: Insufficient documentation

## 2012-12-01 NOTE — Patient Instructions (Signed)
You have a solitary density in the right breast in the upper outer quadrant. This is very close to the muscle and ribs and the radiologist are unable to safely perform a biopsy of this.  The radiologist described this as a lymph node in the 9:30 position, a moderately low risk finding.   They have offered MRI to you as an other imaging modality. You have  stated that you have found that your insurance will not cover this expensive test.  Your physical exam reveals no abnormality of the breast or the regional lymph nodes.  Appropriate options at this point in time are to proceed with a surgical biopsy, or to follow this closely with mammogram and ultrasound in 6 months. We have discussed the 2 options at length.  You stated that your preference is to have close clinical followup with mammogram and ultrasound in 6 months. You also are going to think about this and if you few change your mind we will simply go ahead with a right lumpectomy with wire localization in the near future. We have discussed that procedure in some detail.  For now we're going to schedule you to return to see Dr. Derrell Lolling in 6 months. If you change your mind we will schedule the biopsy in the near future.      Lumpectomy A lumpectomy is a form of "breast conserving" or "breast preservation" surgery. It may also be referred to as a partial mastectomy. During a lumpectomy, the portion of the breast that contains the cancerous tumor or breast mass (the lump) is removed. Some normal tissue around the lump may also be removed to make sure all the tumor has been removed. This surgery should take 40 minutes or less. LET [facility] CARE PROVIDER KNOW ABOUT:  Any allergies you have.  All medicines you are taking, including vitamins, herbs, eye drops, creams, and over-the-counter medicines.  Previous problems you or members of your family have had with the use of anesthetics.  Any blood disorders you have.  Previous surgeries  you have had.  Medical conditions you have. RISKS AND COMPLICATIONS Generally, this is a safe procedure. However, as with any procedure, complications can occur. Possible complications include:  Bleeding.  Infection.  Pain.  Temporary swelling.  Change in the shape of the breast, particularly if a large portion is removed. BEFORE THE PROCEDURE  Ask your health care provider about changing or stopping your regular medicines.  Do not eat or drink anything for 7 8 hours before the surgery or as directed by your health care provider. Ask your health care provider if you can take a sip of water with any approved medicines.  On the day of surgery, your healthcare provider will use a mammogram or ultrasound to locate and mark the tumor in your breast. These markings on your breast will show where the cut (incision) will be made. PROCEDURE   An IV tube will be put into one of your veins.  You may be given medicine to help you relax before the surgery (sedative). You will be given one of the following:  A medicine that numbs the area (local anesthesia).  A medicine that makes you go to sleep (general anesthesia).  Your health care provider will use a kind of electric scalpel that uses heat to minimize bleeding (electrocautery knife).  A curved incision (like a smile or frown) that follows the natural curve of your breast is made, to allow for minimal scarring and better healing.  The  tumor will be removed with some of the surrounding tissue. This will be sent to the lab for analysis. Your health care provider may also remove your lymph nodes at this time if needed.  Sometimes, but not always, a rubber tube called a drain will be surgically inserted into your breast area or armpit to collect excess fluid that may accumulate in the space where the tumor was. This drain is connected to a plastic bulb on the outside of your body. This drain creates suction to help remove the fluid.  The  incisions will be closed with stitches (sutures).  A bandage may be placed over the incisions. AFTER THE PROCEDURE  You will be taken to the recovery area.  You will be given medicine for pain.  A small rubber drain may be placed in the breast for 2 3 days to prevent a collection of blood (hematoma) from developing in the breast. You will be given instructions on caring for the drain before you go home.  A pressure bandage (dressing) will be applied for 1 2 days to prevent bleeding. Ask your health care provider how to care for your bandage at home. Document Released: 02/12/2006 Document Revised: 09/03/2012 Document Reviewed: 06/06/2012 [facility] Patient Information 2014 [place], [place].

## 2012-12-01 NOTE — Progress Notes (Signed)
Patient ID: Amanda Bentley, female   DOB: 06-Mar-1955, 57 y.o.   MRN: [id number]  Chief Complaint  Patient presents with  . Other    breast mass    HPI Amanda Bentley is a 57 y.o. female.  She is referred by Dr. Vincente Poli, her gynecologist, and Dr. Whitney Muse at the at the breast center of [facility] for evaluation and discussion of options for a right breast mammographic abnormality.  The patient has no prior history of breast disease or breast procedures. Recent screening mammograms and subsequent diagnostic mammograms and right breast ultrasound show a 1.1 x 0.2 x 0.9 cm density in the right breast at the 9:30 position. This is posteriorly located and not suitable for stereotactic biopsy. It is thought to represent a lymph node by ultrasound criteria. Breasts are mostly fatty replaced.  Because they could not biopsy this she was given the option of MRI, surgical biopsy, or 6 months followup.  Family history is significant for breast cancer in a maternal grandmother, recently diagnosed ovarian cancer in a paternal aunt, and a sister recently diagnosed with gastric cancer.  Comorbidities include low-dose hormone replacement therapy, carpal tunnel, weight gain and left bundle branch block.  Social history reveals that she is the breadwinner for her family. Her husband has Parkinson disease and recently injured his  shoulder and he is unable to work. . She works as a Architect. She is quite anxious about finances and her responsibility to her husband.  HPI  Past Medical History  Diagnosis Date  . Hypertension   . GERD (gastroesophageal reflux disease)   . Endometriosis     Past Surgical History  Procedure Laterality Date  . Laparoscopy      No family history on file.  Social History History  Substance Use Topics  . Smoking status: Never Smoker   . Smokeless tobacco: Not on file  . Alcohol Use: No    Allergies  Allergen Reactions  . Iohexol      Desc: pt had swelling  from iv contrast yrs ago. pt also does not like to take prednisone for it's side effects. she experiences facial tingling and the benadryl exacerbates this. did fine w/ all premeds and contrast., Onset Date: [id number]     Current Outpatient Prescriptions  Medication Sig Dispense Refill  . metoprolol succinate (TOPROL-XL) 100 MG 24 hr tablet Take 100 mg by mouth daily. Take with or immediately following a meal.       No current facility-administered medications for this visit.    Review of Systems Review of Systems  Constitutional: Negative for fever, chills and unexpected weight change.  HENT: Negative for congestion, hearing loss, sore throat, trouble swallowing and voice change.   Eyes: Negative for visual disturbance.  Respiratory: Negative for cough and wheezing.   Cardiovascular: Negative for chest pain, palpitations and leg swelling.  Gastrointestinal: Negative for nausea, vomiting, abdominal pain, diarrhea, constipation, blood in stool, abdominal distention and anal bleeding.  Genitourinary: Negative for hematuria, vaginal bleeding and difficulty urinating.  Musculoskeletal: Negative for arthralgias.       Bilateral carpal tunnel syndrome. She's had injections. She has been advised to have carpal tunnel surgery by Dr. Teressa Senter.   Skin: Negative for rash and wound.  Neurological: Negative for seizures, syncope and headaches.  Hematological: Negative for adenopathy. Does not bruise/bleed easily.  Psychiatric/Behavioral: Negative for confusion.    Blood pressure 130/78, pulse 68, temperature 98.6 F (37 C), temperature source Oral, resp. rate 14, height 5\' 7"  (1.702 m),  weight 212 lb 6 oz (96.333 kg).  Physical Exam Physical Exam  Constitutional: She is oriented to person, place, and time. She appears well-developed and well-nourished. No distress.  HENT:  Head: Normocephalic and atraumatic.  Nose: Nose normal.  Mouth/Throat: No oropharyngeal exudate.  Eyes: Conjunctivae and  EOM are normal. Pupils are equal, round, and reactive to light. Left eye exhibits no discharge. No scleral icterus.  Neck: Neck supple. No JVD present. No tracheal deviation present. No thyromegaly present.  Cardiovascular: Normal rate, regular rhythm, normal heart sounds and intact distal pulses.   No murmur heard. Pulmonary/Chest: Effort normal and breath sounds normal. No respiratory distress. She has no wheezes. She has no rales. She exhibits no tenderness.  Breasts are medium size. Soft.. Mostly fatty replaced. No skin change. No palpable mass. No axillary adenopathy.  Abdominal: Soft. Bowel sounds are normal. She exhibits no distension and no mass. There is no tenderness. There is no rebound and no guarding ( breasts are mostly fatty replaced.).  Musculoskeletal: She exhibits no edema and no tenderness.  Lymphadenopathy:    She has no cervical adenopathy.  Neurological: She is alert and oriented to person, place, and time. She exhibits normal muscle tone. Coordination normal.  Skin: Skin is warm. No rash noted. She is not diaphoretic. No erythema. No pallor.  Psychiatric: She has a normal mood and affect. Her behavior is normal. Judgment and thought content normal.    Data Reviewed All imaging studies and reports. Office notes from Dr. Vincente Poli.   Assessment    Abnormal mammogram right breast. Solitary 1.1 cm density right breast, 9:30 position, with ultrasound characteristics of a lymph node. This is a moderately low risk finding malignancy cannot be completely excluded on the basis of physical exam and imaging studies.  Inability to perform image guided biopsy because of posterior location of density  Family history of breast cancer in maternal grandmother, ovarian cancer in paternal and and gastric cancer in sister     Plan    I had a very long conversation with this patient. Offered to proceed with right lumpectomy with wire localization to clarify diagnosis.. I am mildly  concerned about her family history and possible genetic issues. . She seems a little reluctant to have the surgery and is worried about cost.  Basically she had difficulty making a decision after lengthy conversation For many reasons.. I also offered to see her in 6 months after a six-month interval imaging studies. She is not really sure what she wants to do but did not want to schedule the biopsy.  We will give her the surgical codes for lumpectomy with wire localization so she can check insurance coverage for that with her insurance company  We will tentatively schedule for her to come back and see me in 6 months after imaging studies.  She knows that if she changes her mind and would like to go ahead with a biopsy that we will schedule that in the next 2 or 3 weeks. I encouraged her to think about this overnight and let us know tomorrow for her decision is.        Angelia Mould. Derrell Lolling, M.D., [facility] General and Minimally invasive Surgery Breast and Colorectal Surgery Office:   [phone] Pager:   [phone]  12/01/2012, 5:17 PM

## 2012-12-02 ENCOUNTER — Telehealth (INDEPENDENT_AMBULATORY_CARE_PROVIDER_SITE_OTHER): Payer: Self-pay | Admitting: *Deleted

## 2012-12-02 ENCOUNTER — Encounter (INDEPENDENT_AMBULATORY_CARE_PROVIDER_SITE_OTHER): Payer: Self-pay | Admitting: *Deleted

## 2012-12-02 NOTE — Telephone Encounter (Signed)
I spoke with pt regarding her appt information for her follow up MM and [name] for May 2015.  The patient requested I send her a letter out because she currently has nothing to write on and will not remember this appt information.  A letter will be sent out today.

## 2012-12-10 ENCOUNTER — Ambulatory Visit (INDEPENDENT_AMBULATORY_CARE_PROVIDER_SITE_OTHER): Payer: Self-pay | Admitting: General Surgery

## 2013-03-25 ENCOUNTER — Encounter (HOSPITAL_BASED_OUTPATIENT_CLINIC_OR_DEPARTMENT_OTHER): Payer: Self-pay | Admitting: *Deleted

## 2013-03-25 ENCOUNTER — Other Ambulatory Visit: Payer: Self-pay | Admitting: Orthopedic Surgery

## 2013-03-26 ENCOUNTER — Encounter (HOSPITAL_BASED_OUTPATIENT_CLINIC_OR_DEPARTMENT_OTHER): Payer: Self-pay | Admitting: *Deleted

## 2013-03-26 NOTE — Progress Notes (Signed)
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

## 2013-03-27 ENCOUNTER — Encounter (HOSPITAL_BASED_OUTPATIENT_CLINIC_OR_DEPARTMENT_OTHER)
Admission: RE | Admit: 2013-03-27 | Discharge: 2013-03-27 | Disposition: A | Discharge status: Home / Self Care | Payer: BC Managed Care – PPO | Source: Ambulatory Visit | Attending: Orthopedic Surgery | Admitting: Orthopedic Surgery

## 2013-03-27 DIAGNOSIS — Z01812 Encounter for preprocedural laboratory examination: Secondary | ICD-10-CM | POA: Insufficient documentation

## 2013-03-27 DIAGNOSIS — Z01810 Encounter for preprocedural cardiovascular examination: Secondary | ICD-10-CM | POA: Insufficient documentation

## 2013-03-27 LAB — BASIC METABOLIC PANEL
BUN: 11 mg/dL (ref 6–23)
CHLORIDE: 103 meq/L (ref 96–112)
CO2: 26 mEq/L (ref 19–32)
CREATININE: 0.65 mg/dL (ref 0.50–1.10)
Calcium: 9.5 mg/dL (ref 8.4–10.5)
GFR calc Af Amer: >90 mL/min (ref >90)
GFR calc non Af Amer: >90 mL/min (ref >90)
Glucose, Bld: 106 mg/dL — ABNORMAL HIGH (ref 70–99)
Potassium: 3.8 mEq/L (ref 3.7–5.3)
Sodium: 144 mEq/L (ref 137–147)

## 2013-03-27 NOTE — Progress Notes (Signed)
EKG reviewed by Dr Jacklynn BueMassagee. No new orders

## 2013-03-30 NOTE — H&P (Signed)
  Amanda PihDeborah Bentley is an 58 y.o. female.   Chief Complaint: c/o chronic and progressive numbness and tingling of the right hand HPI: . 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 She has a history of bilateral hand numbness dating back more than a year. She has been trying to sleep with the splint at night without relief. She has symptoms every night and every morning. She frequently awakens. She has no history of injury. Her job does involve quite a bit of typing.     Past Medical History  Diagnosis Date  . Hypertension   . GERD (gastroesophageal reflux disease)   . Endometriosis   . LBBB (left bundle branch block)     Past Surgical History  Procedure Laterality Date  . Laparoscopy  [id number]    explor lap  . Ercp    . Upper gi endoscopy    . Colonoscopy    . Dilation and curettage of uterus      History reviewed. No pertinent family history. Social History:  reports that she has never smoked. She does not have any smokeless tobacco history on file. She reports that she does not drink alcohol or use illicit drugs.  Allergies:  Allergies  Allergen Reactions  . Iohexol      Desc: pt had swelling from iv contrast yrs ago. pt also does not like to take prednisone for it's side effects. she experiences facial tingling and the benadryl exacerbates this. did fine w/ all premeds and contrast., Onset Date: [id number]     No prescriptions prior to admission    No results found for this or any previous visit (from the past 48 hour(s)).  No results found.   Pertinent items are noted in HPI.  Height 5\' 7"  (1.702 m), weight 96.163 kg (212 lb).  General appearance: alert Head: Normocephalic, without obvious abnormality Neck: supple, symmetrical, trachea midline Resp: clear to auscultation bilaterally Cardio: regular rate and rhythm GI: normal findings: bowel sounds normal Extremities:. Inspection of her hands reveals  no thenar atrophy or deformity. She has diminished sweat patterns in the median distribution. She has positive provocative signs for carpal tunnel syndrome bilaterally. Her pulses and capillary refill are intact. She has no sign of stenosing tenosynovitis of her fingers or thumb. She has no tenderness at the first dorsal compartment.   Dr. Johna RolesPelligra completed screening electrodiagnostic studies. These revealed evidence of moderately severe bilateral carpal tunnel syndrome.   Pulses: 2+ and symmetric Skin: normal Neurologic: Grossly normal    Assessment/Plan Impression: Right CTS  Plan: To the OR for right CTR.The procedure, risks,benefits and post-op course were discussed with the patient at length and they were in agreement with the plan.  DASNOIT,Lavoris Canizales J 03/30/2013, 5:02 PM   H&P documentation: 03/31/2013  -History and Physical Reviewed  -Patient has been re-examined  -No change in the plan of care  Wyn Forsterobert V Westlee Devita Jr, MD

## 2013-03-31 ENCOUNTER — Encounter (HOSPITAL_BASED_OUTPATIENT_CLINIC_OR_DEPARTMENT_OTHER): Payer: Self-pay | Admitting: *Deleted

## 2013-03-31 ENCOUNTER — Ambulatory Visit (HOSPITAL_BASED_OUTPATIENT_CLINIC_OR_DEPARTMENT_OTHER)
Admission: RE | Admit: 2013-03-31 | Discharge: 2013-03-31 | Disposition: A | Discharge status: Home / Self Care | Payer: BC Managed Care – PPO | Source: Ambulatory Visit | Attending: Orthopedic Surgery | Admitting: Orthopedic Surgery

## 2013-03-31 ENCOUNTER — Encounter (HOSPITAL_BASED_OUTPATIENT_CLINIC_OR_DEPARTMENT_OTHER): Payer: BC Managed Care – PPO | Admitting: Anesthesiology

## 2013-03-31 ENCOUNTER — Encounter (HOSPITAL_BASED_OUTPATIENT_CLINIC_OR_DEPARTMENT_OTHER)
Admission: RE | Disposition: A | Discharge status: Home / Self Care | Payer: Self-pay | Source: Ambulatory Visit | Attending: Orthopedic Surgery

## 2013-03-31 ENCOUNTER — Ambulatory Visit (HOSPITAL_BASED_OUTPATIENT_CLINIC_OR_DEPARTMENT_OTHER): Payer: BC Managed Care – PPO | Admitting: Anesthesiology

## 2013-03-31 DIAGNOSIS — N809 Endometriosis, unspecified: Secondary | ICD-10-CM | POA: Insufficient documentation

## 2013-03-31 DIAGNOSIS — I447 Left bundle-branch block, unspecified: Secondary | ICD-10-CM | POA: Insufficient documentation

## 2013-03-31 DIAGNOSIS — I10 Essential (primary) hypertension: Secondary | ICD-10-CM | POA: Insufficient documentation

## 2013-03-31 DIAGNOSIS — K219 Gastro-esophageal reflux disease without esophagitis: Secondary | ICD-10-CM | POA: Insufficient documentation

## 2013-03-31 DIAGNOSIS — G5600 Carpal tunnel syndrome, unspecified upper limb: Secondary | ICD-10-CM | POA: Insufficient documentation

## 2013-03-31 HISTORY — PX: CARPAL TUNNEL RELEASE: SHX101

## 2013-03-31 HISTORY — DX: Left bundle-branch block, unspecified: I44.7

## 2013-03-31 LAB — POCT HEMOGLOBIN-HEMACUE: HEMOGLOBIN: 15.4 g/dL — AB (ref 12.0–15.0)

## 2013-03-31 SURGERY — CARPAL TUNNEL RELEASE
Anesthesia: General | Site: Wrist | Laterality: Right

## 2013-03-31 MED ORDER — OXYCODONE HCL 5 MG/5ML PO SOLN: 5.0000 mg | Freq: Once | ORAL | Status: DC | PRN

## 2013-03-31 MED ORDER — LIDOCAINE HCL 2 % IJ SOLN
INTRAMUSCULAR | Status: AC
  Filled 2013-03-31: qty 20

## 2013-03-31 MED ORDER — PROPOFOL 10 MG/ML IV BOLUS
INTRAVENOUS | Status: DC | PRN
  Administered 2013-03-31: 180 mg via INTRAVENOUS

## 2013-03-31 MED ORDER — DEXAMETHASONE SODIUM PHOSPHATE 10 MG/ML IJ SOLN
INTRAMUSCULAR | Status: DC | PRN
  Administered 2013-03-31: 10 mg via INTRAVENOUS

## 2013-03-31 MED ORDER — FENTANYL CITRATE 0.05 MG/ML IJ SOLN
INTRAMUSCULAR | Status: DC | PRN
  Administered 2013-03-31 (×2): 50 ug via INTRAVENOUS

## 2013-03-31 MED ORDER — CHLORHEXIDINE GLUCONATE 4 % EX LIQD: 60.0000 mL | Freq: Once | CUTANEOUS | Status: DC

## 2013-03-31 MED ORDER — OXYCODONE-ACETAMINOPHEN 5-325 MG PO TABS: ORAL_TABLET | ORAL | Status: DC

## 2013-03-31 MED ORDER — OXYCODONE HCL 5 MG PO TABS: 5.0000 mg | ORAL_TABLET | Freq: Once | ORAL | Status: DC | PRN

## 2013-03-31 MED ORDER — MIDAZOLAM HCL 2 MG/2ML IJ SOLN: 1.0000 mg | INTRAMUSCULAR | Status: DC | PRN

## 2013-03-31 MED ORDER — ONDANSETRON HCL 4 MG/2ML IJ SOLN
INTRAMUSCULAR | Status: DC | PRN
  Administered 2013-03-31: 4 mg via INTRAVENOUS

## 2013-03-31 MED ORDER — ONDANSETRON HCL 4 MG/2ML IJ SOLN
4.0000 mg | Freq: Once | INTRAMUSCULAR | Status: AC | PRN
  Administered 2013-03-31: 4 mg via INTRAVENOUS

## 2013-03-31 MED ORDER — CEFAZOLIN SODIUM-DEXTROSE 2-3 GM-% IV SOLR
2.0000 g | INTRAVENOUS | Status: DC
Start: 2013-03-31 — End: 2013-03-31

## 2013-03-31 MED ORDER — FENTANYL CITRATE 0.05 MG/ML IJ SOLN: 50.0000 ug | INTRAMUSCULAR | Status: DC | PRN

## 2013-03-31 MED ORDER — ONDANSETRON HCL 4 MG/2ML IJ SOLN
INTRAMUSCULAR | Status: AC
  Filled 2013-03-31: qty 2

## 2013-03-31 MED ORDER — CEFAZOLIN SODIUM-DEXTROSE 2-3 GM-% IV SOLR
INTRAVENOUS | Status: AC
  Filled 2013-03-31: qty 50

## 2013-03-31 MED ORDER — LIDOCAINE HCL (CARDIAC) 20 MG/ML IV SOLN
INTRAVENOUS | Status: DC | PRN
  Administered 2013-03-31: 60 mg via INTRAVENOUS

## 2013-03-31 MED ORDER — FENTANYL CITRATE 0.05 MG/ML IJ SOLN
INTRAMUSCULAR | Status: AC
  Filled 2013-03-31: qty 2

## 2013-03-31 MED ORDER — LIDOCAINE HCL 2 % IJ SOLN
INTRAMUSCULAR | Status: DC | PRN
  Administered 2013-03-31: 5 mL

## 2013-03-31 MED ORDER — HYDROMORPHONE HCL PF 1 MG/ML IJ SOLN
INTRAMUSCULAR | Status: AC
  Filled 2013-03-31: qty 1

## 2013-03-31 MED ORDER — HYDROMORPHONE HCL PF 1 MG/ML IJ SOLN
0.2500 mg | INTRAMUSCULAR | Status: DC | PRN
  Administered 2013-03-31: 0.5 mg via INTRAVENOUS

## 2013-03-31 MED ORDER — LACTATED RINGERS IV SOLN
INTRAVENOUS | Status: DC
  Administered 2013-03-31: 10:00:00 via INTRAVENOUS
  Administered 2013-03-31: 20 mL/h via INTRAVENOUS

## 2013-03-31 MED ORDER — MIDAZOLAM HCL 5 MG/5ML IJ SOLN
INTRAMUSCULAR | Status: DC | PRN
  Administered 2013-03-31: 2 mg via INTRAVENOUS

## 2013-03-31 MED ORDER — MIDAZOLAM HCL 2 MG/2ML IJ SOLN
INTRAMUSCULAR | Status: AC
  Filled 2013-03-31: qty 2

## 2013-03-31 SURGICAL SUPPLY — 41 items
BANDAGE ADH SHEER 1  50/CT (GAUZE/BANDAGES/DRESSINGS) IMPLANT
BANDAGE ELASTIC 3 VELCRO ST LF (GAUZE/BANDAGES/DRESSINGS) ×1 IMPLANT
BLADE SURG 15 STRL LF DISP TIS (BLADE) ×1 IMPLANT
BLADE SURG 15 STRL SS (BLADE) ×2
BNDG CMPR 9X4 STRL LF SNTH (GAUZE/BANDAGES/DRESSINGS) ×1
BNDG COHESIVE 3X5 TAN STRL LF (GAUZE/BANDAGES/DRESSINGS) ×1 IMPLANT
BNDG ESMARK 4X9 LF (GAUZE/BANDAGES/DRESSINGS) ×1 IMPLANT
BRUSH SCRUB EZ PLAIN DRY (MISCELLANEOUS) ×2 IMPLANT
CORDS BIPOLAR (ELECTRODE) ×1 IMPLANT
COVER MAYO STAND STRL (DRAPES) ×2 IMPLANT
COVER TABLE BACK 60X90 (DRAPES) ×2 IMPLANT
CUFF TOURNIQUET SINGLE 18IN (TOURNIQUET CUFF) ×1 IMPLANT
DECANTER SPIKE VIAL GLASS SM (MISCELLANEOUS) IMPLANT
DRAPE EXTREMITY T 121X128X90 (DRAPE) ×2 IMPLANT
DRAPE SURG 17X23 STRL (DRAPES) ×2 IMPLANT
GLOVE BIOGEL M STRL SZ7.5 (GLOVE) ×2 IMPLANT
GLOVE BIOGEL PI IND STRL 7.0 (GLOVE) IMPLANT
GLOVE BIOGEL PI INDICATOR 7.0 (GLOVE) ×2
GLOVE ECLIPSE 6.5 STRL STRAW (GLOVE) ×2 IMPLANT
GLOVE ORTHO TXT STRL SZ7.5 (GLOVE) ×2 IMPLANT
GOWN STRL REUS W/ TWL LRG LVL3 (GOWN DISPOSABLE) ×1 IMPLANT
GOWN STRL REUS W/ TWL XL LVL3 (GOWN DISPOSABLE) ×2 IMPLANT
GOWN STRL REUS W/TWL LRG LVL3 (GOWN DISPOSABLE) ×4
GOWN STRL REUS W/TWL XL LVL3 (GOWN DISPOSABLE) ×2
NEEDLE 27GAX1X1/2 (NEEDLE) ×1 IMPLANT
PACK BASIN DAY SURGERY FS (CUSTOM PROCEDURE TRAY) ×2 IMPLANT
PAD CAST 3X4 CTTN HI CHSV (CAST SUPPLIES) ×1 IMPLANT
PADDING CAST ABS 4INX4YD NS (CAST SUPPLIES) ×1
PADDING CAST ABS COTTON 4X4 ST (CAST SUPPLIES) ×1 IMPLANT
PADDING CAST COTTON 3X4 STRL (CAST SUPPLIES) ×2
SPLINT PLASTER CAST XFAST 3X15 (CAST SUPPLIES) ×5 IMPLANT
SPLINT PLASTER XTRA FASTSET 3X (CAST SUPPLIES) ×5
SPONGE GAUZE 4X4 12PLY (GAUZE/BANDAGES/DRESSINGS) ×2 IMPLANT
STOCKINETTE 4X48 STRL (DRAPES) ×2 IMPLANT
STRIP CLOSURE SKIN 1/2X4 (GAUZE/BANDAGES/DRESSINGS) ×2 IMPLANT
SUT PROLENE 3 0 PS 2 (SUTURE) ×2 IMPLANT
SYR 3ML 23GX1 SAFETY (SYRINGE) IMPLANT
SYR CONTROL 10ML LL (SYRINGE) ×1 IMPLANT
TOWEL OR 17X24 6PK STRL BLUE (TOWEL DISPOSABLE) ×2 IMPLANT
TRAY DSU PREP LF (CUSTOM PROCEDURE TRAY) ×2 IMPLANT
UNDERPAD 30X30 INCONTINENT (UNDERPADS AND DIAPERS) ×2 IMPLANT

## 2013-03-31 NOTE — Anesthesia Procedure Notes (Signed)
Procedure Name: LMA Insertion Date/Time: 03/31/2013 11:36 AM Performed by: Gar GibbonKEETON, Naraya Stoneberg S Pre-anesthesia Checklist: Patient identified, Emergency Drugs available, Suction available and Patient being monitored Patient Re-evaluated:Patient Re-evaluated prior to inductionOxygen Delivery Method: Circle System Utilized Preoxygenation: Pre-oxygenation with 100% oxygen Intubation Type: IV induction Ventilation: Mask ventilation without difficulty LMA: LMA inserted LMA Size: 4.0 Number of attempts: 1 Airway Equipment and Method: bite block Placement Confirmation: positive ETCO2 Tube secured with: Tape Dental Injury: Teeth and Oropharynx as per pre-operative assessment

## 2013-03-31 NOTE — Op Note (Signed)
[id number]  

## 2013-03-31 NOTE — Transfer of Care (Signed)
Immediate Anesthesia Transfer of Care Note  Patient: Amanda Bentley  Procedure(s) Performed: Procedure(s): RIGHT CARPAL TUNNEL RELEASE (Right)  Patient Location: PACU  Anesthesia Type:General  Level of Consciousness: awake, sedated and patient cooperative  Airway & Oxygen Therapy: Patient Spontanous Breathing and Patient connected to face mask oxygen  Post-op Assessment: Report given to PACU RN and Post -op Vital signs reviewed and stable  Post vital signs: Reviewed and stable  Complications: No apparent anesthesia complications

## 2013-03-31 NOTE — Anesthesia Preprocedure Evaluation (Signed)
Anesthesia Evaluation  Patient identified by MRN, date of birth, ID band Patient awake    Reviewed: Allergy & Precautions, H&P , NPO status , Patient's Chart, lab work & pertinent test results  Airway Mallampati: II TM Distance: >3 FB Neck ROM: Full    Dental  (+) Teeth Intact, Dental Advisory Given   Pulmonary  breath sounds clear to auscultation        Cardiovascular hypertension, Rhythm:Regular Rate:Normal     Neuro/Psych    GI/Hepatic   Endo/Other    Renal/GU      Musculoskeletal   Abdominal   Peds  Hematology   Anesthesia Other Findings   Reproductive/Obstetrics                           Anesthesia Physical Anesthesia Plan  ASA: II  Anesthesia Plan: General   Post-op Pain Management:    Induction: Intravenous  Airway Management Planned: LMA  Additional Equipment:   Intra-op Plan:   Post-operative Plan:   Informed Consent: I have reviewed the patients History and Physical, chart, labs and discussed the procedure including the risks, benefits and alternatives for the proposed anesthesia with the patient or authorized representative who has indicated his/her understanding and acceptance.   Dental advisory given  Plan Discussed with: CRNA and Anesthesiologist  Anesthesia Plan Comments:         Anesthesia Quick Evaluation  

## 2013-03-31 NOTE — Anesthesia Postprocedure Evaluation (Signed)
  Anesthesia Post-op Note  Patient: Amanda Bentley  Procedure(s) Performed: Procedure(s): RIGHT CARPAL TUNNEL RELEASE (Right)  Patient Location: PACU  Anesthesia Type:General  Level of Consciousness: awake, alert , oriented and patient cooperative  Airway and Oxygen Therapy: Patient Spontanous Breathing  Post-op Pain: none  Post-op Assessment: Post-op Vital signs reviewed, Patient's Cardiovascular Status Stable, Respiratory Function Stable, Patent Airway and Pain level controlled, nausea improved  Post-op Vital Signs: Reviewed and stable  Complications: No apparent anesthesia complications

## 2013-03-31 NOTE — Brief Op Note (Signed)
03/31/2013  12:01 PM  PATIENT:  Amanda Bentley  58 y.o. female  PRE-OPERATIVE DIAGNOSIS:  RIGHT CARPAL TUNNEL SYNDROME  POST-OPERATIVE DIAGNOSIS:  RIGHT CARPAL TUNNEL SYNDROME  PROCEDURE:  Procedure(s): RIGHT CARPAL TUNNEL RELEASE (Right)  SURGEON:  Surgeon(s) and Role:    * Wyn Forsterobert V Giovanie Lefebre Jr., MD - Primary  PHYSICIAN ASSISTANT:   ASSISTANTS: surgical tech  ANESTHESIA:   general  EBL:     BLOOD ADMINISTERED:none  DRAINS: none   LOCAL MEDICATIONS USED:  XYLOCAINE   SPECIMEN:  No Specimen  DISPOSITION OF SPECIMEN:  N/A  COUNTS:  YES  TOURNIQUET:   Total Tourniquet Time Documented: Upper Arm (Right) - 10 minutes Total: Upper Arm (Right) - 10 minutes   DICTATION: .Other Dictation: Dictation Number [id number]  PLAN OF CARE: Discharge to home after PACU  PATIENT DISPOSITION:  PACU - hemodynamically stable.   Delay start of Pharmacological VTE agent (>24hrs) due to surgical blood loss or risk of bleeding: not applicable

## 2013-03-31 NOTE — Discharge Instructions (Addendum)

## 2013-04-01 ENCOUNTER — Encounter (HOSPITAL_BASED_OUTPATIENT_CLINIC_OR_DEPARTMENT_OTHER): Payer: Self-pay | Admitting: Orthopedic Surgery

## 2013-04-02 NOTE — Op Note (Signed)
[name], [name]            ACCOUNT NO.:  [id number]  MEDICAL RECORD NO.:  [id number]  LOCATION:                                 FACILITY:  PHYSICIAN:  Katy Fitch. Mahlia Fernando, M.D.      DATE OF BIRTH:  DATE OF PROCEDURE:  03/31/2013 DATE OF DISCHARGE:                              OPERATIVE REPORT   PREOPERATIVE DIAGNOSIS:  Chronic right carpal tunnel syndrome.  POSTOPERATIVE DIAGNOSIS:  Chronic right carpal tunnel syndrome.  OPERATIONS:  Release of right transverse carpal ligament.  OPERATING SURGEON:  Katy Fitch. Danell Verno, M.D..  ASSISTANT:  Surgical technician.  ANESTHESIA:  General by LMA.  SUPERVISING ANESTHESIOLOGIST:  Guadalupe Maple, M.D.  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  In 2013, she presented for evaluation of hand numbness and  was noted have carpal tunnel syndrome clinically.  She was sent for electrodiagnostic studies to confirm bilateral carpal tunnel syndrome. For period of time. she responded to splinting, injection, and activity modification.  She plays the organ at [employer] and is having increasing impairment at this time.  She has decided to proceed after detailed informed consent with release of the right transverse carpal ligament.  Preoperatively, she was interviewed by Dr. Noreene Bentley and the anesthesia team in the holding area.  General anesthesia with LMA technique was recommended and accepted.  The right hand was marked as a proper surgical site per protocol with marking pen in the holding area.  Questions regarding the anticipated procedure as well as postop medication and aftercare questions were invited and answered in detail in the holding area.  PROCEDURE:  Amanda Bentley was brought to room 2 at the [facility] and placed in supine position on the operating table.  Under Dr.  Morley Kos direct supervision, general anesthesia by LMA technique was induced.  The right hand and arm were then prepped with Betadine soap and solution, sterilely draped.  A pneumatic tourniquet was applied to the proximal right brachium.  Following exsanguination of the right arm with Esmarch bandage, the arterial tourniquet was inflated to 220 mmHg.  A routine surgical time-out was accomplished.  We then proceeded with a 2 cm incision in the line of the ring finger of the proximal palm. Subcutaneous tissues were carefully divided revealing the palmar fascia. This split line of its fibers to reveal the common sensory branch of the median nerve and the superficial palmar arch.  There was a hypothenar muscle extending across the transverse carpal ligament which was gently teased apart looking for aberrant motor branches.  None were identified.  After hemostasis was achieved with bipolar cautery, the transverse carpal ligament was released along its ulnar border extending into the distal forearm.  The contents of carpal canal were under extreme pressure.  The leaflets of the transverse carpal ligament spread more than 12 mm after release.  The volar forearm fascia was released 4 cm above the distal wrist flexion crease subcutaneously into the distal forearm.  Bleeding points along the margin of the released tissues were electrocauterized with bipolar forceps followed by repair of the skin with  intradermal 3-0 Prolene.  Steri-Strips applied followed by infiltration of the wound margins with 2% lidocaine for postoperative comfort.  For aftercare, Amanda Bentley will be provided a prescription for Percocet 5 mg 1 or 2 tablets p.o. q.4-6 hours p.r.n. pain, 20 tablets without refill.     Katy Fitchobert V. Rhylie Stehr, M.D.     RVS/MEDQ  D:  03/31/2013  T:  04/01/2013  Job:  [id number]  cc:   Dario Guardianandace T. Smith, M.D.

## 2013-05-06 ENCOUNTER — Other Ambulatory Visit: Payer: BC Managed Care – PPO

## 2013-05-08 ENCOUNTER — Ambulatory Visit
Admission: RE | Admit: 2013-05-08 | Discharge: 2013-05-08 | Disposition: A | Discharge status: Home / Self Care | Payer: BC Managed Care – PPO | Source: Ambulatory Visit | Attending: General Surgery | Admitting: General Surgery

## 2013-05-08 ENCOUNTER — Encounter (INDEPENDENT_AMBULATORY_CARE_PROVIDER_SITE_OTHER): Payer: Self-pay

## 2013-05-08 DIAGNOSIS — N6310 Unspecified lump in the right breast, unspecified quadrant: Secondary | ICD-10-CM

## 2013-05-21 ENCOUNTER — Other Ambulatory Visit: Payer: BC Managed Care – PPO

## 2013-06-15 ENCOUNTER — Encounter (INDEPENDENT_AMBULATORY_CARE_PROVIDER_SITE_OTHER): Payer: Self-pay | Admitting: General Surgery

## 2013-10-08 ENCOUNTER — Other Ambulatory Visit: Payer: Self-pay | Admitting: Obstetrics and Gynecology

## 2013-10-08 DIAGNOSIS — N63 Unspecified lump in unspecified breast: Secondary | ICD-10-CM

## 2013-10-16 ENCOUNTER — Other Ambulatory Visit: Payer: Self-pay | Admitting: Orthopedic Surgery

## 2013-11-02 ENCOUNTER — Ambulatory Visit
Admission: RE | Admit: 2013-11-02 | Discharge: 2013-11-02 | Disposition: A | Discharge status: Home / Self Care | Payer: BC Managed Care – PPO | Source: Ambulatory Visit | Attending: Obstetrics and Gynecology | Admitting: Obstetrics and Gynecology

## 2013-11-02 ENCOUNTER — Encounter (INDEPENDENT_AMBULATORY_CARE_PROVIDER_SITE_OTHER): Payer: Self-pay

## 2013-11-02 DIAGNOSIS — N63 Unspecified lump in unspecified breast: Secondary | ICD-10-CM

## 2013-11-16 ENCOUNTER — Other Ambulatory Visit: Payer: Self-pay | Admitting: Obstetrics and Gynecology

## 2013-11-17 LAB — CYTOLOGY - PAP

## 2014-01-05 ENCOUNTER — Encounter (HOSPITAL_BASED_OUTPATIENT_CLINIC_OR_DEPARTMENT_OTHER): Payer: Self-pay | Admitting: *Deleted

## 2014-01-05 NOTE — Progress Notes (Signed)
Pt here 3/15 for rt ctr-was done general-does not want that-took long time to wake up Will need istat-having cataracts done tomorrow

## 2014-01-12 ENCOUNTER — Encounter (HOSPITAL_BASED_OUTPATIENT_CLINIC_OR_DEPARTMENT_OTHER): Payer: Self-pay | Admitting: Orthopedic Surgery

## 2014-01-12 ENCOUNTER — Ambulatory Visit (HOSPITAL_BASED_OUTPATIENT_CLINIC_OR_DEPARTMENT_OTHER): Payer: BC Managed Care – PPO | Admitting: Anesthesiology

## 2014-01-12 ENCOUNTER — Ambulatory Visit (HOSPITAL_BASED_OUTPATIENT_CLINIC_OR_DEPARTMENT_OTHER)
Admission: RE | Admit: 2014-01-12 | Discharge: 2014-01-12 | Disposition: A | Discharge status: Home / Self Care | Payer: BC Managed Care – PPO | Source: Ambulatory Visit | Attending: Orthopedic Surgery | Admitting: Orthopedic Surgery

## 2014-01-12 ENCOUNTER — Encounter (HOSPITAL_BASED_OUTPATIENT_CLINIC_OR_DEPARTMENT_OTHER)
Admission: RE | Disposition: A | Discharge status: Home / Self Care | Payer: Self-pay | Source: Ambulatory Visit | Attending: Orthopedic Surgery

## 2014-01-12 DIAGNOSIS — G5602 Carpal tunnel syndrome, left upper limb: Secondary | ICD-10-CM | POA: Insufficient documentation

## 2014-01-12 DIAGNOSIS — K219 Gastro-esophageal reflux disease without esophagitis: Secondary | ICD-10-CM | POA: Insufficient documentation

## 2014-01-12 DIAGNOSIS — I447 Left bundle-branch block, unspecified: Secondary | ICD-10-CM | POA: Diagnosis not present

## 2014-01-12 DIAGNOSIS — I10 Essential (primary) hypertension: Secondary | ICD-10-CM | POA: Insufficient documentation

## 2014-01-12 HISTORY — PX: CARPAL TUNNEL RELEASE: SHX101

## 2014-01-12 HISTORY — DX: Other complications of anesthesia, initial encounter: T88.59XA

## 2014-01-12 HISTORY — DX: Other specified postprocedural states: Z98.890

## 2014-01-12 HISTORY — DX: Nausea with vomiting, unspecified: R11.2

## 2014-01-12 HISTORY — DX: Adverse effect of unspecified anesthetic, initial encounter: T41.45XA

## 2014-01-12 LAB — POCT I-STAT, CHEM 8
BUN: 10 mg/dL (ref 6–23)
CALCIUM ION: 1.12 mmol/L (ref 1.12–1.23)
Chloride: 104 mEq/L (ref 96–112)
Creatinine, Ser: 0.7 mg/dL (ref 0.50–1.10)
Glucose, Bld: 104 mg/dL — ABNORMAL HIGH (ref 70–99)
HEMATOCRIT: 43 % (ref 36.0–46.0)
Hemoglobin: 14.6 g/dL (ref 12.0–15.0)
Potassium: 3.5 mmol/L (ref 3.5–5.1)
Sodium: 142 mmol/L (ref 135–145)
TCO2: 23 mmol/L (ref 0–100)

## 2014-01-12 SURGERY — CARPAL TUNNEL RELEASE
Anesthesia: General | Site: Wrist | Laterality: Left

## 2014-01-12 MED ORDER — DEXAMETHASONE SODIUM PHOSPHATE 4 MG/ML IJ SOLN
INTRAMUSCULAR | Status: DC | PRN
  Administered 2014-01-12: 10 mg via INTRAVENOUS

## 2014-01-12 MED ORDER — MIDAZOLAM HCL 5 MG/5ML IJ SOLN
INTRAMUSCULAR | Status: DC | PRN
  Administered 2014-01-12: 2 mg via INTRAVENOUS

## 2014-01-12 MED ORDER — SCOPOLAMINE 1 MG/3DAYS TD PT72
MEDICATED_PATCH | TRANSDERMAL | Status: AC
  Filled 2014-01-12: qty 1

## 2014-01-12 MED ORDER — FENTANYL CITRATE 0.05 MG/ML IJ SOLN
INTRAMUSCULAR | Status: AC
  Filled 2014-01-12: qty 4

## 2014-01-12 MED ORDER — FENTANYL CITRATE 0.05 MG/ML IJ SOLN: 50.0000 ug | INTRAMUSCULAR | Status: DC | PRN

## 2014-01-12 MED ORDER — PROPOFOL 10 MG/ML IV BOLUS
INTRAVENOUS | Status: DC | PRN
  Administered 2014-01-12: 200 mg via INTRAVENOUS

## 2014-01-12 MED ORDER — CHLORHEXIDINE GLUCONATE 4 % EX LIQD: 60.0000 mL | Freq: Once | CUTANEOUS | Status: DC

## 2014-01-12 MED ORDER — CEFAZOLIN SODIUM-DEXTROSE 2-3 GM-% IV SOLR
INTRAVENOUS | Status: AC
  Filled 2014-01-12: qty 50

## 2014-01-12 MED ORDER — BUPIVACAINE HCL (PF) 0.25 % IJ SOLN
INTRAMUSCULAR | Status: DC | PRN
  Administered 2014-01-12: 8 mL

## 2014-01-12 MED ORDER — ONDANSETRON HCL 4 MG/2ML IJ SOLN
INTRAMUSCULAR | Status: DC | PRN
  Administered 2014-01-12: 4 mg via INTRAVENOUS

## 2014-01-12 MED ORDER — SCOPOLAMINE 1 MG/3DAYS TD PT72
1.0000 | MEDICATED_PATCH | TRANSDERMAL | Status: DC
  Administered 2014-01-12: 1.5 mg via TRANSDERMAL

## 2014-01-12 MED ORDER — OXYCODONE HCL 5 MG PO TABS
5.0000 mg | ORAL_TABLET | Freq: Once | ORAL | Status: AC
  Administered 2014-01-12: 5 mg via ORAL

## 2014-01-12 MED ORDER — OXYCODONE-ACETAMINOPHEN 10-325 MG PO TABS: 1.0000 | ORAL_TABLET | ORAL | Status: DC | PRN

## 2014-01-12 MED ORDER — LIDOCAINE HCL (CARDIAC) 20 MG/ML IV SOLN
INTRAVENOUS | Status: DC | PRN
  Administered 2014-01-12: 50 mg via INTRAVENOUS

## 2014-01-12 MED ORDER — FENTANYL CITRATE 0.05 MG/ML IJ SOLN: 25.0000 ug | INTRAMUSCULAR | Status: DC | PRN

## 2014-01-12 MED ORDER — LACTATED RINGERS IV SOLN
INTRAVENOUS | Status: DC
  Administered 2014-01-12: 09:00:00 via INTRAVENOUS

## 2014-01-12 MED ORDER — ONDANSETRON HCL 4 MG/2ML IJ SOLN: 4.0000 mg | Freq: Once | INTRAMUSCULAR | Status: DC | PRN

## 2014-01-12 MED ORDER — MIDAZOLAM HCL 2 MG/2ML IJ SOLN: 1.0000 mg | INTRAMUSCULAR | Status: DC | PRN

## 2014-01-12 MED ORDER — PROPOFOL INFUSION 10 MG/ML OPTIME
INTRAVENOUS | Status: DC | PRN
  Administered 2014-01-12: 100 ug/kg/min via INTRAVENOUS

## 2014-01-12 MED ORDER — BUPIVACAINE HCL (PF) 0.25 % IJ SOLN
INTRAMUSCULAR | Status: AC
  Filled 2014-01-12: qty 30

## 2014-01-12 MED ORDER — CEFAZOLIN SODIUM-DEXTROSE 2-3 GM-% IV SOLR: 2.0000 g | INTRAVENOUS | Status: DC

## 2014-01-12 MED ORDER — MIDAZOLAM HCL 2 MG/2ML IJ SOLN
INTRAMUSCULAR | Status: AC
  Filled 2014-01-12: qty 2

## 2014-01-12 MED ORDER — OXYCODONE HCL 5 MG PO TABS
ORAL_TABLET | ORAL | Status: AC
  Filled 2014-01-12: qty 1

## 2014-01-12 SURGICAL SUPPLY — 37 items
BLADE SURG 15 STRL LF DISP TIS (BLADE) ×1 IMPLANT
BLADE SURG 15 STRL SS (BLADE) ×2
BNDG CMPR 9X4 STRL LF SNTH (GAUZE/BANDAGES/DRESSINGS) ×1
BNDG COHESIVE 3X5 TAN STRL LF (GAUZE/BANDAGES/DRESSINGS) ×2 IMPLANT
BNDG ESMARK 4X9 LF (GAUZE/BANDAGES/DRESSINGS) ×1 IMPLANT
BNDG GAUZE ELAST 4 BULKY (GAUZE/BANDAGES/DRESSINGS) ×2 IMPLANT
CHLORAPREP W/TINT 26ML (MISCELLANEOUS) ×2 IMPLANT
CORDS BIPOLAR (ELECTRODE) ×2 IMPLANT
COVER BACK TABLE 60X90IN (DRAPES) ×2 IMPLANT
COVER MAYO STAND STRL (DRAPES) ×2 IMPLANT
CUFF TOURNIQUET SINGLE 18IN (TOURNIQUET CUFF) ×2 IMPLANT
DRAPE EXTREMITY T 121X128X90 (DRAPE) ×2 IMPLANT
DRAPE SURG 17X23 STRL (DRAPES) ×2 IMPLANT
DRSG PAD ABDOMINAL 8X10 ST (GAUZE/BANDAGES/DRESSINGS) ×2 IMPLANT
GAUZE SPONGE 4X4 12PLY STRL (GAUZE/BANDAGES/DRESSINGS) ×2 IMPLANT
GAUZE XEROFORM 1X8 LF (GAUZE/BANDAGES/DRESSINGS) ×2 IMPLANT
GLOVE BIO SURGEON STRL SZ 6.5 (GLOVE) ×1 IMPLANT
GLOVE BIOGEL PI IND STRL 7.0 (GLOVE) IMPLANT
GLOVE BIOGEL PI IND STRL 8 (GLOVE) IMPLANT
GLOVE BIOGEL PI IND STRL 8.5 (GLOVE) ×1 IMPLANT
GLOVE BIOGEL PI INDICATOR 7.0 (GLOVE) ×2
GLOVE BIOGEL PI INDICATOR 8 (GLOVE) ×1
GLOVE BIOGEL PI INDICATOR 8.5 (GLOVE) ×1
GLOVE SURG ORTHO 8.0 STRL STRW (GLOVE) ×2 IMPLANT
GOWN STRL REUS W/ TWL LRG LVL3 (GOWN DISPOSABLE) ×1 IMPLANT
GOWN STRL REUS W/TWL LRG LVL3 (GOWN DISPOSABLE) ×2
GOWN STRL REUS W/TWL XL LVL3 (GOWN DISPOSABLE) ×3 IMPLANT
NDL PRECISIONGLIDE 27X1.5 (NEEDLE) IMPLANT
NEEDLE PRECISIONGLIDE 27X1.5 (NEEDLE) IMPLANT
NS IRRIG 1000ML POUR BTL (IV SOLUTION) ×2 IMPLANT
PACK BASIN DAY SURGERY FS (CUSTOM PROCEDURE TRAY) ×2 IMPLANT
STOCKINETTE 4X48 STRL (DRAPES) ×2 IMPLANT
SUT VICRYL 4-0 PS2 18IN ABS (SUTURE) IMPLANT
SYR BULB 3OZ (MISCELLANEOUS) ×2 IMPLANT
SYR CONTROL 10ML LL (SYRINGE) ×1 IMPLANT
TOWEL OR 17X24 6PK STRL BLUE (TOWEL DISPOSABLE) ×2 IMPLANT
UNDERPAD 30X30 INCONTINENT (UNDERPADS AND DIAPERS) ×2 IMPLANT

## 2014-01-12 NOTE — Op Note (Signed)
Dictation Number [id number]

## 2014-01-12 NOTE — Anesthesia Postprocedure Evaluation (Signed)
  Anesthesia Post-op Note  Patient: Amanda Bentley  Procedure(s) Performed: Procedure(s): LEFT CARPAL TUNNEL RELEASE (Left)  Patient Location: PACU  Anesthesia Type: General   Level of Consciousness: awake, alert  and oriented  Airway and Oxygen Therapy: Patient Spontanous Breathing  Post-op Pain: mild  Post-op Assessment: Post-op Vital signs reviewed  Post-op Vital Signs: Reviewed  Last Vitals:  Filed Vitals:   01/12/14 1200  BP: 156/79  Pulse: 73  Temp:   Resp: 20    Complications: No apparent anesthesia complications

## 2014-01-12 NOTE — Anesthesia Preprocedure Evaluation (Signed)
Anesthesia Evaluation  Patient identified by MRN, date of birth, ID band Patient awake    Reviewed: Allergy & Precautions, H&P , NPO status , Patient's Chart, lab work & pertinent test results, reviewed documented beta blocker date and time   History of Anesthesia Complications (+) PONV  Airway Mallampati: II  TM Distance: >3 FB Neck ROM: Full    Dental  (+) Teeth Intact, Dental Advisory Given   Pulmonary  breath sounds clear to auscultation        Cardiovascular hypertension, Pt. on medications and Pt. on home beta blockers Rhythm:Regular Rate:Normal     Neuro/Psych    GI/Hepatic GERD-  Medicated and Controlled,  Endo/Other    Renal/GU      Musculoskeletal   Abdominal   Peds  Hematology   Anesthesia Other Findings   Reproductive/Obstetrics                             Anesthesia Physical Anesthesia Plan  ASA: II  Anesthesia Plan: General   Post-op Pain Management:    Induction: Intravenous  Airway Management Planned: LMA  Additional Equipment:   Intra-op Plan:   Post-operative Plan: Extubation in OR  Informed Consent: I have reviewed the patients History and Physical, chart, labs and discussed the procedure including the risks, benefits and alternatives for the proposed anesthesia with the patient or authorized representative who has indicated his/her understanding and acceptance.   Dental advisory given  Plan Discussed with: CRNA, Anesthesiologist and Surgeon  Anesthesia Plan Comments: (Pt  Had "2 weeks" of lethargy after her last general anesthetic.)        Anesthesia Quick Evaluation

## 2014-01-12 NOTE — Brief Op Note (Signed)
01/12/2014  10:50 AM  PATIENT:  Amanda Bentley  58 y.o. female  PRE-OPERATIVE DIAGNOSIS:  LEFT CARPAL TUNNEL SYNDROME  POST-OPERATIVE DIAGNOSIS:  left carpal tunnel syndrome  PROCEDURE:  Procedure(s): LEFT CARPAL TUNNEL RELEASE (Left)  SURGEON:  Surgeon(s) and Role:    * Cindee SaltGary Clova Morlock, MD - Primary    * Betha LoaKevin Prajna Vanderpool, MD - Assisting  PHYSICIAN ASSISTANT:   ASSISTANTS: K Celenia Hruska,MD   ANESTHESIA:   local and general  EBL:     BLOOD ADMINISTERED:none  DRAINS: none   LOCAL MEDICATIONS USED:  BUPIVICAINE   SPECIMEN:  No Specimen  DISPOSITION OF SPECIMEN:  N/A  COUNTS:  YES  TOURNIQUET:   Total Tourniquet Time Documented: Forearm (Left) - 15 minutes Total: Forearm (Left) - 15 minutes   DICTATION: .Other Dictation: Dictation Number [id number]  PLAN OF CARE: Discharge to home after PACU  PATIENT DISPOSITION:  PACU - hemodynamically stable.

## 2014-01-12 NOTE — Op Note (Signed)
NAMAngelina Pih:  Sharber, Adelyna            ACCOUNT NO.:  [id number]  MEDICAL RECORD NO.:  [id number]  LOCATION:                                 FACILITY:  PHYSICIAN:  Cindee SaltGary Ren Grasse, M.D.            DATE OF BIRTH:  DATE OF PROCEDURE:  01/12/2014 DATE OF DISCHARGE:                              OPERATIVE REPORT   PREOPERATIVE DIAGNOSIS:  Carpal tunnel syndrome, left hand.  POSTOPERATIVE DIAGNOSIS:  Carpal tunnel syndrome, left hand.  OPERATION:  Decompression left median nerve.  SURGEON:  Cindee SaltGary Shaconda Hajduk, MD  ASSISTANT:  Betha LoaKevin Kiera Hussey, MD  ANESTHESIA:  General with local infiltration.  ANESTHESIOLOGIST:  Sheldon Silvanavid Crews, MD  HISTORY:  The patient is a 58 year old female with a history of carpal tunnel syndrome.  Nerve conduction is positive.  This has not responded to conservative treatment.  She has elected to undergo surgical decompression to the median nerve of the left hand.  Pre, peri, and postoperative course have been discussed along with risks and complications.  She is aware that there is no guarantee with the surgery, possibility of infection, recurrence of injury to arteries, nerves, tendons, incomplete relief of symptoms, and dystrophy.  In the preoperative area, the patient is seen, the extremity marked by both the patient and surgeon, antibiotic given.  PROCEDURE IN DETAIL:  The patient was brought to the operating room where a general anesthetic was carried out without difficulty under the direction Dr. Ivin Bootyrews.  She was prepped using ChloraPrep, supine position, left arm free.  A 3-minute dry time was allowed.  Time-out taken, confirming the patient and procedure.  A longitudinal incision was made in the left palm, carried down through subcutaneous tissue.  Bleeders were electrocauterized.  Palmar fascia was split.  Superficial palmar arch identified.  Flexor tendon to the ring and little finger identified.  To the ulnar side of the median nerve, the carpal retinaculum was incised  with sharp dissection.  Right angle and Sewall retractor were placed between skin and forearm fascia.  The fascia was released for approximately a centimeter and half proximal to the wrist crease under direct vision.  Canal was explored.  Air of compression to the nerve was apparent.  Motor branch entered into muscle.  No further lesions were identified.  The wound was irrigated with saline and closed with a subcuticular 4-0 Monocryl suture.  Steri-Strips were applied. Local infiltration with 0.25% bupivacaine without epinephrine was given, approximately 6 mL was used.  Sterile compressive dressing with the fingers free was applied.  On deflation of the tourniquet, all fingers immediately pinked.  She was taken to the recovery room for observation in satisfactory condition.  She will be discharged home [facility] 1 week on Percocet.          ______________________________ Cindee SaltGary Darianny Momon, M.D.     GK/MEDQ  D:  01/12/2014  T:  01/12/2014  Job:  [id number]

## 2014-01-12 NOTE — Transfer of Care (Signed)
Immediate Anesthesia Transfer of Care Note  Patient: Amanda Bentley  Procedure(s) Performed: Procedure(s): LEFT CARPAL TUNNEL RELEASE (Left)  Patient Location: PACU  Anesthesia Type:General  Level of Consciousness: sedated and responds to stimulation  Airway & Oxygen Therapy: Patient Spontanous Breathing and Patient connected to face mask oxygen  Post-op Assessment: Report given to PACU RN and Post -op Vital signs reviewed and stable  Post vital signs: Reviewed and stable  Complications: No apparent anesthesia complications

## 2014-01-12 NOTE — H&P (Signed)
Amanda Bentley is a 58 year-old right-hand dominant female complaining of numbness and tingling, carpal tunnel syndrome on her left side. She has undergone a carpal tunnel release on her right side with positive nerve conductions which were done in 2013 by Dr. Johna RolesPelligra.  She has done well on her right side. She has history of injury to her neck.  She sees Dr. Lovell SheehanJenkins for this.  She is awakened 2/7 nights.  She has had multiple injections, but this has become more unbearable for her. She has no history of injury to the hand or neck per se.  She has no history of diabetes or thyroid problems.  She does have history of arthritis, no history of gout.   She complains of a constant, moderate to severe, throbbing aching pain with  a feeling of swelling, numbness and weakness.  She states it is getting worse. She has been taking Advil with minimal relief.  She does wear a brace.   ALLERGIES:     None.  MEDICATIONS:     Toprol. SURGICAL HISTORY:     Carpal tunnel release (right side), endometriosis surgery. FAMILY MEDICAL HISTORY:   Positive for high blood pressure, arthritis. SOCIAL HISTORY:     She does not smoke or drink.  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 REVIEW OF SYSTEMS:     Positive for glasses, high blood pressure, otherwise negative 14 points.   Amanda Bentley is an 58 y.o. female.   Chief Complaint: CTS left HPI: see above  Past Medical History  Diagnosis Date  . Hypertension   . GERD (gastroesophageal reflux disease)   . Endometriosis   . LBBB (left bundle branch block)   . Complication of anesthesia     problems waking up-3/15    Past Surgical History  Procedure Laterality Date  . Laparoscopy  [id number]    explor lap  . Ercp    . Upper gi endoscopy    . Colonoscopy    . Dilation and curettage of uterus    . Carpal tunnel release Right 03/31/2013    Procedure: RIGHT CARPAL TUNNEL RELEASE;  Surgeon: Wyn Forsterobert V Sypher  Jr., MD;  Location: [facility];  Service: Orthopedics;  Laterality: Right;  . Eye surgery      lt cataract 12/15  . Eye surgery      rt cataract 01/06/14    History reviewed. No pertinent family history. Social History:  reports that she has never smoked. She does not have any smokeless tobacco history on file. She reports that she does not drink alcohol or use illicit drugs.  Allergies:  Allergies  Allergen Reactions  . Iohexol      Desc: pt had swelling from iv contrast yrs ago. pt also does not like to take prednisone for it's side effects. she experiences facial tingling and the benadryl exacerbates this. did fine w/ all premeds and contrast., Onset Date: [id number]     No prescriptions prior to admission    No results found for this or any previous visit (from the past 48 hour(s)).  No results found.   Pertinent items are noted in HPI.  Height 5\' 7"  (1.702 m), weight 95.255 kg (210 lb).  General appearance: alert, cooperative and appears stated age Head: Normocephalic, without obvious abnormality Neck: no JVD Resp: clear to auscultation bilaterally Cardio: regular rate and rhythm, S1, S2 normal, no murmur, click, rub or gallop GI:  soft, non-tender; bowel sounds normal; no masses,  no organomegaly Extremities: numbness left hand Pulses: 2+ and symmetric Skin: Skin color, texture, turgor normal. No rashes or lesions Neurologic: Grossly normal Incision Assessment/Plan DIAGNOSIS:    Carpal tunnel syndrome, left side.  Status post carpal tunnel release, right side.  She has cervical spondylosis.      The pre, peri and postoperative course were discussed along with the risks and complications.  The patient is aware there is no guarantee with the surgery, possibility of infection, recurrence, injury to arteries, nerves, tendons, incomplete relief of symptoms and dystrophy.    [name] 01/12/2014, 5:42 AM

## 2014-01-12 NOTE — Anesthesia Procedure Notes (Signed)
Procedure Name: LMA Insertion Date/Time: 01/12/2014 10:20 AM Performed by: Gar GibbonKEETON, Shannan Slinker S Pre-anesthesia Checklist: Patient identified, Emergency Drugs available, Suction available and Patient being monitored Patient Re-evaluated:Patient Re-evaluated prior to inductionOxygen Delivery Method: Circle System Utilized Preoxygenation: Pre-oxygenation with 100% oxygen Intubation Type: IV induction Ventilation: Mask ventilation without difficulty LMA: LMA inserted LMA Size: 4.0 Number of attempts: 1 Airway Equipment and Method: bite block Placement Confirmation: positive ETCO2 Tube secured with: Tape Dental Injury: Teeth and Oropharynx as per pre-operative assessment

## 2014-01-12 NOTE — Discharge Instructions (Addendum)

## 2014-01-13 ENCOUNTER — Encounter (HOSPITAL_BASED_OUTPATIENT_CLINIC_OR_DEPARTMENT_OTHER): Payer: Self-pay | Admitting: Orthopedic Surgery

## 2014-10-08 ENCOUNTER — Other Ambulatory Visit: Payer: Self-pay

## 2014-10-08 ENCOUNTER — Other Ambulatory Visit: Payer: Self-pay | Admitting: Obstetrics and Gynecology

## 2014-10-08 DIAGNOSIS — N6310 Unspecified lump in the right breast, unspecified quadrant: Secondary | ICD-10-CM

## 2014-11-03 ENCOUNTER — Other Ambulatory Visit: Payer: Self-pay

## 2014-11-05 ENCOUNTER — Ambulatory Visit
Admission: RE | Admit: 2014-11-05 | Discharge: 2014-11-05 | Disposition: A | Discharge status: Home / Self Care | Payer: 59 | Source: Ambulatory Visit | Attending: Obstetrics and Gynecology | Admitting: Obstetrics and Gynecology

## 2014-11-05 ENCOUNTER — Other Ambulatory Visit: Payer: Self-pay

## 2014-11-05 DIAGNOSIS — N6310 Unspecified lump in the right breast, unspecified quadrant: Secondary | ICD-10-CM

## 2015-03-30 ENCOUNTER — Encounter (HOSPITAL_BASED_OUTPATIENT_CLINIC_OR_DEPARTMENT_OTHER): Payer: Self-pay | Admitting: *Deleted

## 2015-03-30 ENCOUNTER — Other Ambulatory Visit: Payer: Self-pay | Admitting: Orthopedic Surgery

## 2015-05-05 ENCOUNTER — Ambulatory Visit (HOSPITAL_BASED_OUTPATIENT_CLINIC_OR_DEPARTMENT_OTHER)
Admission: RE | Admit: 2015-05-05 | Payer: BLUE CROSS/BLUE SHIELD | Source: Ambulatory Visit | Admitting: Orthopedic Surgery

## 2015-05-05 SURGERY — RELEASE, A1 PULLEY, FOR TRIGGER FINGER
Anesthesia: Regional | Site: Thumb | Laterality: Left

## 2015-10-24 ENCOUNTER — Other Ambulatory Visit: Payer: Self-pay | Admitting: Obstetrics and Gynecology

## 2015-10-24 DIAGNOSIS — Z1231 Encounter for screening mammogram for malignant neoplasm of breast: Secondary | ICD-10-CM

## 2015-11-08 ENCOUNTER — Ambulatory Visit: Payer: BLUE CROSS/BLUE SHIELD

## 2015-11-15 ENCOUNTER — Ambulatory Visit
Admission: RE | Admit: 2015-11-15 | Discharge: 2015-11-15 | Disposition: A | Discharge status: Home / Self Care | Payer: BLUE CROSS/BLUE SHIELD | Source: Ambulatory Visit | Attending: Obstetrics and Gynecology | Admitting: Obstetrics and Gynecology

## 2015-11-15 DIAGNOSIS — Z1231 Encounter for screening mammogram for malignant neoplasm of breast: Secondary | ICD-10-CM

## 2015-11-30 ENCOUNTER — Other Ambulatory Visit (HOSPITAL_COMMUNITY)
Admission: RE | Admit: 2015-11-30 | Discharge: 2015-11-30 | Disposition: A | Discharge status: Home / Self Care | Payer: BLUE CROSS/BLUE SHIELD | Source: Ambulatory Visit | Attending: Family Medicine | Admitting: Family Medicine

## 2015-11-30 ENCOUNTER — Other Ambulatory Visit: Payer: Self-pay | Admitting: Physician Assistant

## 2015-11-30 DIAGNOSIS — Z01419 Encounter for gynecological examination (general) (routine) without abnormal findings: Secondary | ICD-10-CM | POA: Insufficient documentation

## 2015-12-01 LAB — CYTOLOGY - PAP: DIAGNOSIS: NEGATIVE

## 2016-11-08 ENCOUNTER — Other Ambulatory Visit: Payer: Self-pay | Admitting: Obstetrics and Gynecology

## 2016-11-08 DIAGNOSIS — Z1231 Encounter for screening mammogram for malignant neoplasm of breast: Secondary | ICD-10-CM

## 2016-11-16 ENCOUNTER — Ambulatory Visit
Admission: RE | Admit: 2016-11-16 | Discharge: 2016-11-16 | Disposition: A | Discharge status: Home / Self Care | Payer: BLUE CROSS/BLUE SHIELD | Source: Ambulatory Visit | Attending: Obstetrics and Gynecology | Admitting: Obstetrics and Gynecology

## 2016-11-16 DIAGNOSIS — Z1231 Encounter for screening mammogram for malignant neoplasm of breast: Secondary | ICD-10-CM

## 2016-11-17 LAB — GLUCOSE, POCT (MANUAL RESULT ENTRY): POC Glucose: 136 mg/dl — AB (ref 70–99)

## 2016-11-28 ENCOUNTER — Ambulatory Visit: Payer: BLUE CROSS/BLUE SHIELD

## 2017-06-12 ENCOUNTER — Other Ambulatory Visit: Payer: Self-pay | Admitting: Family Medicine

## 2017-06-12 ENCOUNTER — Ambulatory Visit
Admission: RE | Admit: 2017-06-12 | Discharge: 2017-06-12 | Disposition: A | Discharge status: Home / Self Care | Payer: BLUE CROSS/BLUE SHIELD | Source: Ambulatory Visit | Attending: Family Medicine | Admitting: Family Medicine

## 2017-06-12 DIAGNOSIS — R109 Unspecified abdominal pain: Secondary | ICD-10-CM

## 2017-09-18 ENCOUNTER — Other Ambulatory Visit: Payer: Self-pay | Admitting: Family Medicine

## 2017-09-18 ENCOUNTER — Ambulatory Visit
Admission: RE | Admit: 2017-09-18 | Discharge: 2017-09-18 | Disposition: A | Discharge status: Home / Self Care | Payer: BLUE CROSS/BLUE SHIELD | Source: Ambulatory Visit | Attending: Family Medicine | Admitting: Family Medicine

## 2017-09-18 DIAGNOSIS — R053 Chronic cough: Secondary | ICD-10-CM

## 2017-09-18 DIAGNOSIS — R05 Cough: Secondary | ICD-10-CM

## 2017-11-14 ENCOUNTER — Other Ambulatory Visit: Payer: Self-pay | Admitting: Obstetrics and Gynecology

## 2017-11-14 ENCOUNTER — Other Ambulatory Visit: Payer: Self-pay | Admitting: Family Medicine

## 2017-11-14 DIAGNOSIS — Z1231 Encounter for screening mammogram for malignant neoplasm of breast: Secondary | ICD-10-CM

## 2017-11-29 ENCOUNTER — Ambulatory Visit
Admission: RE | Admit: 2017-11-29 | Discharge: 2017-11-29 | Disposition: A | Discharge status: Home / Self Care | Payer: BLUE CROSS/BLUE SHIELD | Source: Ambulatory Visit | Attending: Family Medicine | Admitting: Family Medicine

## 2017-11-29 ENCOUNTER — Other Ambulatory Visit: Payer: Self-pay | Admitting: Family Medicine

## 2017-11-29 ENCOUNTER — Ambulatory Visit: Payer: BLUE CROSS/BLUE SHIELD

## 2017-11-29 DIAGNOSIS — Z1231 Encounter for screening mammogram for malignant neoplasm of breast: Secondary | ICD-10-CM

## 2017-12-24 ENCOUNTER — Institutional Professional Consult (permissible substitution): Payer: BLUE CROSS/BLUE SHIELD | Admitting: Pulmonary Disease

## 2019-01-23 ENCOUNTER — Other Ambulatory Visit: Payer: Self-pay | Admitting: Family Medicine

## 2019-01-23 DIAGNOSIS — Z1231 Encounter for screening mammogram for malignant neoplasm of breast: Secondary | ICD-10-CM

## 2019-01-24 ENCOUNTER — Encounter (HOSPITAL_COMMUNITY): Payer: Self-pay | Admitting: Emergency Medicine

## 2019-01-24 ENCOUNTER — Emergency Department (HOSPITAL_COMMUNITY)
Admission: EM | Admit: 2019-01-24 | Discharge: 2019-01-25 | Disposition: A | Discharge status: Home / Self Care | Payer: 59 | Attending: Emergency Medicine | Admitting: Emergency Medicine

## 2019-01-24 ENCOUNTER — Other Ambulatory Visit: Payer: Self-pay

## 2019-01-24 DIAGNOSIS — H538 Other visual disturbances: Secondary | ICD-10-CM | POA: Diagnosis not present

## 2019-01-24 DIAGNOSIS — I10 Essential (primary) hypertension: Secondary | ICD-10-CM | POA: Insufficient documentation

## 2019-01-24 NOTE — ED Triage Notes (Addendum)
Patient reports elevated blood pressure at home this evening 230/128 , denies SOB , no chest pain , denies emesis or fever.

## 2019-01-25 ENCOUNTER — Emergency Department (HOSPITAL_COMMUNITY): Payer: 59

## 2019-01-25 LAB — BASIC METABOLIC PANEL
Anion gap: 12 (ref 5–15)
BUN: 11 mg/dL (ref 8–23)
CO2: 23 mmol/L (ref 22–32)
Calcium: 9.2 mg/dL (ref 8.9–10.3)
Chloride: 104 mmol/L (ref 98–111)
Creatinine, Ser: 0.75 mg/dL (ref 0.44–1.00)
GFR calc Af Amer: >60 mL/min (ref >60)
GFR calc non Af Amer: >60 mL/min (ref >60)
Glucose, Bld: 119 mg/dL — ABNORMAL HIGH (ref 70–99)
Potassium: 3.5 mmol/L (ref 3.5–5.1)
Sodium: 139 mmol/L (ref 135–145)

## 2019-01-25 LAB — CBC WITH DIFFERENTIAL/PLATELET
Abs Immature Granulocytes: 0.03 10*3/uL (ref 0.00–0.07)
Basophils Absolute: 0.1 10*3/uL (ref 0.0–0.1)
Basophils Relative: 1 %
Eosinophils Absolute: 0.3 10*3/uL (ref 0.0–0.5)
Eosinophils Relative: 3 %
HCT: 44.7 % (ref 36.0–46.0)
Hemoglobin: 15.1 g/dL — ABNORMAL HIGH (ref 12.0–15.0)
Immature Granulocytes: 0 %
Lymphocytes Relative: 23 %
Lymphs Abs: 2.3 10*3/uL (ref 0.7–4.0)
MCH: 32.3 pg (ref 26.0–34.0)
MCHC: 33.8 g/dL (ref 30.0–36.0)
MCV: 95.5 fL (ref 80.0–100.0)
Monocytes Absolute: 1 10*3/uL (ref 0.1–1.0)
Monocytes Relative: 9 %
Neutro Abs: 6.6 10*3/uL (ref 1.7–7.7)
Neutrophils Relative %: 64 %
Platelets: 289 10*3/uL (ref 150–400)
RBC: 4.68 MIL/uL (ref 3.87–5.11)
RDW: 12.4 % (ref 11.5–15.5)
WBC: 10.3 10*3/uL (ref 4.0–10.5)
nRBC: 0 % (ref 0.0–0.2)

## 2019-01-25 LAB — BRAIN NATRIURETIC PEPTIDE: B Natriuretic Peptide: 56.9 pg/mL (ref 0.0–100.0)

## 2019-01-25 LAB — URINALYSIS, ROUTINE W REFLEX MICROSCOPIC
Bilirubin Urine: NEGATIVE
Glucose, UA: NEGATIVE mg/dL
Hgb urine dipstick: NEGATIVE
Ketones, ur: NEGATIVE mg/dL
Leukocytes,Ua: NEGATIVE
Nitrite: NEGATIVE
Protein, ur: NEGATIVE mg/dL
Specific Gravity, Urine: 1.014 (ref 1.005–1.030)
pH: 6 (ref 5.0–8.0)

## 2019-01-25 LAB — TROPONIN I (HIGH SENSITIVITY): Troponin I (High Sensitivity): 7 ng/L (ref <18)

## 2019-01-25 MED ORDER — AMLODIPINE BESYLATE 5 MG PO TABS: 5.0000 mg | ORAL_TABLET | Freq: Every day | ORAL | 0 refills | Status: DC

## 2019-01-25 MED ORDER — AMLODIPINE BESYLATE 5 MG PO TABS
5.0000 mg | ORAL_TABLET | Freq: Once | ORAL | Status: AC
  Administered 2019-01-25: 02:00:00 5 mg via ORAL
  Filled 2019-01-25: qty 1

## 2019-01-25 NOTE — ED Notes (Signed)
Patient verbalizes understanding of discharge instructions. Opportunity for questioning and answers were provided. Armband removed by staff, pt discharged from ED ambulatory.   

## 2019-01-25 NOTE — ED Provider Notes (Signed)
Emergency Department Provider Note   I have reviewed the triage vital signs and the nursing notes.   HISTORY  Chief Complaint Hypertension   HPI Amanda Bentley is a 64 y.o. female who presents to the emergency department today secondary to high blood pressure.  Patient states that she was under a lot of stress recently with her husband recently going on to hospice and other life issues.  She states that this afternoon she had episode that lasted a few minutes where her vision was blurry and she had a throbbing headache.  Her son is a Airline pilot and took her blood pressure and it was over 230 so she presents here for further evaluation.  She states her symptoms have resolved.  She had a little bit of chest pressure during that time as well but is gone at this time.  She had no shortness of breath or lower extremity swelling that abnormal for her.  No recent fevers or cough.  No other associated symptoms.  She states that she has been on her same blood pressure regimen for quite a while now.   No other associated or modifying symptoms.    Past Medical History:  Diagnosis Date  . Complication of anesthesia    problems waking up-3/15  . Endometriosis   . GERD (gastroesophageal reflux disease)   . Hypertension   . LBBB (left bundle branch block)   . PONV (postoperative nausea and vomiting)     Patient Active Problem List   Diagnosis Date Noted  . Mass of right breast 12/01/2012    Past Surgical History:  Procedure Laterality Date  . CARPAL TUNNEL RELEASE Right 03/31/2013   Procedure: RIGHT CARPAL TUNNEL RELEASE;  Surgeon: Cammie Sickle., MD;  Location: [place];  Service: Orthopedics;  Laterality: Right;  . CARPAL TUNNEL RELEASE Left 01/12/2014   Procedure: LEFT CARPAL TUNNEL RELEASE;  Surgeon: Daryll Brod, MD;  Location: [facility];  Service: Orthopedics;  Laterality: Left;  . COLONOSCOPY    . DILATION AND CURETTAGE OF UTERUS      . ERCP    . EYE SURGERY     lt cataract 12/15  . EYE SURGERY     rt cataract 01/06/14  . LAPAROSCOPY  [id number]   explor lap  . UPPER GI ENDOSCOPY      Current Outpatient Rx  . Order #: [id number] Class: Historical Med  . Order #: [id number] Class: Historical Med  . Order #: [id number] Class: Normal    Allergies Iohexol  Family History  Problem Relation Age of Onset  . Breast cancer Maternal Grandmother     Social History Social History   Tobacco Use  . Smoking status: Never Smoker  . Smokeless tobacco: Never Used  Substance Use Topics  . Alcohol use: No  . Drug use: No    Review of Systems  All other systems negative except as documented in the HPI. All pertinent positives and negatives as reviewed in the HPI. ____________________________________________   PHYSICAL EXAM:  VITAL SIGNS: ED Triage Vitals  Enc Vitals Group     BP 01/24/19 2333 (!) 206/86     Pulse Rate 01/24/19 2333 86     Resp 01/24/19 2333 16     Temp 01/24/19 2333 98.6 F (37 C)     Temp Source 01/24/19 2333 Oral     SpO2 01/24/19 2333 97 %    Constitutional: Alert and oriented. Well appearing and in no acute distress. Eyes: Conjunctivae are  normal. PERRL. EOMI. Head: Atraumatic. Nose: No congestion/rhinnorhea. Mouth/Throat: Mucous membranes are moist.  Oropharynx non-erythematous. Neck: No stridor.  No meningeal signs.   Cardiovascular: Normal rate, regular rhythm. Good peripheral circulation. Grossly normal heart sounds.   Respiratory: Normal respiratory effort.  No retractions. Lungs CTAB. Gastrointestinal: Soft and nontender. No distention.  Musculoskeletal: No lower extremity tenderness nor edema. No gross deformities of extremities. Neurologic:  No altered mental status, able to give full seemingly accurate history.  Face is symmetric, EOM's intact, pupils equal and reactive, vision intact, tongue and uvula midline without deviation. Upper and Lower extremity motor 5/5, intact pain  perception in distal extremities, 2+ reflexes in biceps, patella and achilles tendons. Able to perform finger to nose normal with both hands. Walks without assistance or evident ataxia.   Skin:  Skin is warm, dry and intact. No rash noted.   ____________________________________________   LABS (all labs ordered are listed, but only abnormal results are displayed)  Labs Reviewed  CBC WITH DIFFERENTIAL/PLATELET - Abnormal; Notable for the following components:      Result Value   Hemoglobin 15.1 (*)    All other components within normal limits  BASIC METABOLIC PANEL - Abnormal; Notable for the following components:   Glucose, Bld 119 (*)    All other components within normal limits  URINALYSIS, ROUTINE W REFLEX MICROSCOPIC - Abnormal; Notable for the following components:   Color, Urine STRAW (*)    All other components within normal limits  BRAIN NATRIURETIC PEPTIDE  TROPONIN I (HIGH SENSITIVITY)  TROPONIN I (HIGH SENSITIVITY)   ____________________________________________  EKG   EKG Interpretation  Date/Time:  Sunday January 25 2019 00:54:16 EST Ventricular Rate:  83 PR Interval:    QRS Duration: 154 QT Interval:  441 QTC Calculation: 519 R Axis:   -58 Text Interpretation: Sinus rhythm Left bundle branch block No significant change since last tracing Confirmed by Marily Memos [phone]) on 01/25/2019 5:58:34 AM       ____________________________________________  RADIOLOGY  CT Head Wo Contrast  Result Date: 01/25/2019 CLINICAL DATA:  64 year old female with headache. EXAM: CT HEAD WITHOUT CONTRAST TECHNIQUE: Contiguous axial images were obtained from the base of the skull through the vertex without intravenous contrast. COMPARISON:  Facial bone CT dated 05/14/2007 FINDINGS: Brain: The ventricles and sulci appropriate size for patient's age. The gray-white matter discrimination is preserved. There is no acute intracranial hemorrhage. No mass effect or midline shift. No  extra-axial fluid collection. Partially empty sella. Vascular: No hyperdense vessel or unexpected calcification. Skull: Normal. Negative for fracture or focal lesion. Sinuses/Orbits: No acute finding. Other: None IMPRESSION: No acute intracranial pathology. Electronically Signed   By: Elgie Collard M.D.   On: 01/25/2019 01:46   DG Chest Portable 1 View  Result Date: 01/25/2019 CLINICAL DATA:  64 year old female with hypertension and shortness of breath. EXAM: PORTABLE CHEST 1 VIEW COMPARISON:  Chest radiograph dated 09/18/2017. FINDINGS: The heart size and mediastinal contours are within normal limits. Both lungs are clear. The visualized skeletal structures are unremarkable. IMPRESSION: No active disease. Electronically Signed   By: Elgie Collard M.D.   On: 01/25/2019 01:42    ____________________________________________   PROCEDURES  Procedure(s) performed:   Procedures   ____________________________________________   INITIAL IMPRESSION / ASSESSMENT AND PLAN / ED COURSE  Patient with improving blood pressures here I think is likely situational.  No evidence of endorgan damage.  I doubt she had a TIA or stroke with the blurry vision is probably just from having  significantly high blood pressure related to stress.  Her blood pressure is 146/77 prior to discharge.  She will start low dose norvasc and keep a blood pressure log at home and follow-up with her PCP for further management.  Pertinent labs & imaging results that were available during my care of the patient were reviewed by me and considered in my medical decision making (see chart for details).  A medical screening exam was performed and I feel the patient has had an appropriate workup for their chief complaint at this time and likelihood of emergent condition existing is low. They have been counseled on decision, discharge, follow up and which symptoms necessitate immediate return to the emergency department. They or their  family verbally stated understanding and agreement with plan and discharged in stable condition.   ____________________________________________  FINAL CLINICAL IMPRESSION(S) / ED DIAGNOSES  Final diagnoses:  Hypertension, unspecified type     MEDICATIONS GIVEN DURING THIS VISIT:  Medications  amLODipine (NORVASC) tablet 5 mg (5 mg Oral Given 01/25/19 0209)     NEW OUTPATIENT MEDICATIONS STARTED DURING THIS VISIT:  Discharge Medication List as of 01/25/2019  2:04 AM    START taking these medications   Details  amLODipine (NORVASC) 5 MG tablet Take 1 tablet (5 mg total) by mouth daily., Starting Sun 01/25/2019, Normal        Note:  This note was prepared with assistance of Dragon voice recognition software. Occasional wrong-word or sound-a-like substitutions may have occurred due to the inherent limitations of voice recognition software.   Christel Bai, Barbara Cower, MD 01/25/19 [phone]

## 2019-01-25 NOTE — Discharge Instructions (Addendum)
Please keep a log of your blood pressures.  Please check 2-3 times throughout the day.  Do not worry about the actual numbers just keep a log to your doctor can adjust her medications accordingly.  Return to the emergency room if he has any strokelike symptoms, severe chest pain, severe shortness of breath, lower extremity swelling or change in urination or other concerning findings.

## 2019-01-30 ENCOUNTER — Ambulatory Visit
Admission: RE | Admit: 2019-01-30 | Discharge: 2019-01-30 | Disposition: A | Discharge status: Home / Self Care | Payer: BLUE CROSS/BLUE SHIELD | Source: Ambulatory Visit | Attending: Family Medicine | Admitting: Family Medicine

## 2019-01-30 ENCOUNTER — Other Ambulatory Visit: Payer: Self-pay

## 2019-01-30 DIAGNOSIS — Z1231 Encounter for screening mammogram for malignant neoplasm of breast: Secondary | ICD-10-CM

## 2019-06-22 ENCOUNTER — Other Ambulatory Visit: Payer: Self-pay | Admitting: Family Medicine

## 2019-06-22 ENCOUNTER — Other Ambulatory Visit (HOSPITAL_COMMUNITY)
Admission: RE | Admit: 2019-06-22 | Discharge: 2019-06-22 | Disposition: A | Discharge status: Home / Self Care | Payer: 59 | Source: Ambulatory Visit | Attending: Family Medicine | Admitting: Family Medicine

## 2019-06-22 DIAGNOSIS — Z124 Encounter for screening for malignant neoplasm of cervix: Secondary | ICD-10-CM | POA: Insufficient documentation

## 2019-06-25 LAB — CYTOLOGY - PAP
Adequacy: ABSENT
Comment: NEGATIVE
Diagnosis: NEGATIVE
High risk HPV: POSITIVE — AB

## 2019-08-19 DIAGNOSIS — R05 Cough: Secondary | ICD-10-CM | POA: Diagnosis not present

## 2019-10-26 DIAGNOSIS — M9902 Segmental and somatic dysfunction of thoracic region: Secondary | ICD-10-CM | POA: Diagnosis not present

## 2019-10-26 DIAGNOSIS — M5134 Other intervertebral disc degeneration, thoracic region: Secondary | ICD-10-CM | POA: Diagnosis not present

## 2019-10-30 DIAGNOSIS — M546 Pain in thoracic spine: Secondary | ICD-10-CM | POA: Diagnosis not present

## 2019-11-10 ENCOUNTER — Ambulatory Visit: Payer: Medicare HMO | Admitting: Orthopaedic Surgery

## 2019-11-10 ENCOUNTER — Ambulatory Visit (INDEPENDENT_AMBULATORY_CARE_PROVIDER_SITE_OTHER): Payer: Medicare HMO

## 2019-11-10 ENCOUNTER — Other Ambulatory Visit: Payer: Self-pay

## 2019-11-10 VITALS — BP 161/95 | Ht 67.0 in | Wt 226.0 lb

## 2019-11-10 DIAGNOSIS — M542 Cervicalgia: Secondary | ICD-10-CM

## 2019-11-10 DIAGNOSIS — M47812 Spondylosis without myelopathy or radiculopathy, cervical region: Secondary | ICD-10-CM | POA: Diagnosis not present

## 2019-11-10 NOTE — Progress Notes (Signed)
Office Visit Note   Patient: Amanda Bentley           Date of Birth: September 12, 1955           MRN: [id number] Visit Date: 11/10/2019              Requested by: Merri Brunette, MD [phone] [place],  [place] [address] PCP: Merri Brunette, MD   Assessment & Plan: Visit Diagnoses:  1. Neck pain   2. Spondylosis without myelopathy or radiculopathy, cervical region     Plan: We offered her home cervical traction she states she like to wait on this.  We will set up for physical therapy outpatient and recheck her in 5 weeks.  Follow-Up Instructions: Return in about 5 weeks (around 12/15/2019).   Orders:  Orders Placed This Encounter  Procedures  . XR Cervical Spine 2 or 3 views  . Ambulatory referral to Physical Therapy   No orders of the defined types were placed in this encounter.     Procedures: No procedures performed   Clinical Data: No additional findings.   Subjective: Chief Complaint  Patient presents with  . Middle Back - Pain    HPI 64 year old female seen with pain between her shoulder blades significantly increased the last 2 weeks.  She has a husband with Parkinson's and dementia with hospice care and she does a lot of yard work helping with her husband as well.  She was given Celebrex and Skelaxin by [facility] family medicine which did not help.  She has been sleeping on the couch and her husband for the last year in case she has to get up to help with him.  She notices some stiffness in her neck some pain with the flexion.  Pain in her shoulder she rates as severe.  She not really noticed numbness in her hands no gait disturbance.  Pain radiates into the medial aspect of both scapula.  She has history of bulging disks in the lumbar region.  Review of Systems all other systems are negative is obtained HPI.   Objective: Vital Signs: BP (!) 161/95   Ht 5\' 7"  (1.702 m)   Wt 226 lb (102.5 kg)   BMI 35.40 kg/m   Physical  Exam Constitutional:      Appearance: She is well-developed.  HENT:     Head: Normocephalic.     Right Ear: External ear normal.     Left Ear: External ear normal.  Eyes:     Pupils: Pupils are equal, round, and reactive to light.  Neck:     Thyroid: No thyromegaly.     Trachea: No tracheal deviation.  Cardiovascular:     Rate and Rhythm: Normal rate.  Pulmonary:     Effort: Pulmonary effort is normal.  Abdominal:     Palpations: Abdomen is soft.  Skin:    General: Skin is warm and dry.  Neurological:     Mental Status: She is alert and oriented to person, place, and time.  Psychiatric:        Behavior: Behavior normal.     Ortho Exam  Specialty Comments:  No specialty comments available.  Imaging: XR Cervical Spine 2 or 3 views  Result Date: 11/11/2019 AP and lateral cervical spine x-rays are obtained and reviewed as well as lateral flexion-extension films.  This shows 1 to 2 mm anterolisthesis at C4-5.  There is significant narrowing C5-6 with spurring anterior posterior.  No changes at other levels.  Impression: Cervical spondylosis with anterolisthesis at C4-5 and advanced spurring at C5-6.    PMFS History: Patient Active Problem List   Diagnosis Date Noted  . Spondylosis without myelopathy or radiculopathy, cervical region 11/11/2019  . Mass of right breast 12/01/2012   Past Medical History:  Diagnosis Date  . Complication of anesthesia    problems waking up-3/15  . Endometriosis   . GERD (gastroesophageal reflux disease)   . Hypertension   . LBBB (left bundle branch block)   . PONV (postoperative nausea and vomiting)     Family History  Problem Relation Age of Onset  . Breast cancer Maternal Grandmother     Past Surgical History:  Procedure Laterality Date  . CARPAL TUNNEL RELEASE Right 03/31/2013   Procedure: RIGHT CARPAL TUNNEL RELEASE;  Surgeon: Wyn Forster., MD;  Location: [facility];  Service: Orthopedics;  Laterality:  Right;  . CARPAL TUNNEL RELEASE Left 01/12/2014   Procedure: LEFT CARPAL TUNNEL RELEASE;  Surgeon: Cindee Salt, MD;  Location: [facility];  Service: Orthopedics;  Laterality: Left;  . COLONOSCOPY    . DILATION AND CURETTAGE OF UTERUS    . ERCP    . EYE SURGERY     lt cataract 12/15  . EYE SURGERY     rt cataract 01/06/14  . LAPAROSCOPY  [id number]   explor lap  . UPPER GI ENDOSCOPY     Social History   Occupational History  . Not on file  Tobacco Use  . Smoking status: Never Smoker  . Smokeless tobacco: Never Used  Substance and Sexual Activity  . Alcohol use: No  . Drug use: No  . Sexual activity: Not on file

## 2019-11-11 ENCOUNTER — Ambulatory Visit: Payer: PRIVATE HEALTH INSURANCE | Admitting: Orthopedic Surgery

## 2019-11-11 DIAGNOSIS — M47812 Spondylosis without myelopathy or radiculopathy, cervical region: Secondary | ICD-10-CM | POA: Insufficient documentation

## 2019-12-08 ENCOUNTER — Ambulatory Visit: Payer: PRIVATE HEALTH INSURANCE | Admitting: Orthopaedic Surgery

## 2019-12-22 DIAGNOSIS — F439 Reaction to severe stress, unspecified: Secondary | ICD-10-CM | POA: Diagnosis not present

## 2019-12-22 DIAGNOSIS — E669 Obesity, unspecified: Secondary | ICD-10-CM | POA: Diagnosis not present

## 2019-12-22 DIAGNOSIS — I10 Essential (primary) hypertension: Secondary | ICD-10-CM | POA: Diagnosis not present

## 2020-02-18 ENCOUNTER — Other Ambulatory Visit: Payer: Self-pay | Admitting: Family Medicine

## 2020-02-18 DIAGNOSIS — Z1231 Encounter for screening mammogram for malignant neoplasm of breast: Secondary | ICD-10-CM

## 2020-03-10 DIAGNOSIS — A059 Bacterial foodborne intoxication, unspecified: Secondary | ICD-10-CM | POA: Diagnosis not present

## 2020-03-10 DIAGNOSIS — G44209 Tension-type headache, unspecified, not intractable: Secondary | ICD-10-CM | POA: Diagnosis not present

## 2020-04-06 ENCOUNTER — Ambulatory Visit: Payer: PRIVATE HEALTH INSURANCE

## 2020-04-09 ENCOUNTER — Other Ambulatory Visit: Payer: Self-pay

## 2020-04-09 ENCOUNTER — Ambulatory Visit
Admission: RE | Admit: 2020-04-09 | Discharge: 2020-04-09 | Disposition: A | Discharge status: Home / Self Care | Payer: PRIVATE HEALTH INSURANCE | Source: Ambulatory Visit | Attending: Family Medicine | Admitting: Family Medicine

## 2020-04-09 DIAGNOSIS — Z1231 Encounter for screening mammogram for malignant neoplasm of breast: Secondary | ICD-10-CM

## 2020-04-27 DIAGNOSIS — H43393 Other vitreous opacities, bilateral: Secondary | ICD-10-CM | POA: Diagnosis not present

## 2020-04-27 DIAGNOSIS — H43813 Vitreous degeneration, bilateral: Secondary | ICD-10-CM | POA: Diagnosis not present

## 2020-06-29 ENCOUNTER — Other Ambulatory Visit (HOSPITAL_COMMUNITY)
Admission: RE | Admit: 2020-06-29 | Discharge: 2020-06-29 | Disposition: A | Discharge status: Home / Self Care | Payer: Medicare HMO | Source: Ambulatory Visit | Attending: Family Medicine | Admitting: Family Medicine

## 2020-06-29 DIAGNOSIS — I10 Essential (primary) hypertension: Secondary | ICD-10-CM | POA: Diagnosis not present

## 2020-06-29 DIAGNOSIS — Z1151 Encounter for screening for human papillomavirus (HPV): Secondary | ICD-10-CM | POA: Insufficient documentation

## 2020-06-29 DIAGNOSIS — F439 Reaction to severe stress, unspecified: Secondary | ICD-10-CM | POA: Diagnosis not present

## 2020-06-29 DIAGNOSIS — Z0000 Encounter for general adult medical examination without abnormal findings: Secondary | ICD-10-CM | POA: Diagnosis not present

## 2020-06-29 DIAGNOSIS — G43009 Migraine without aura, not intractable, without status migrainosus: Secondary | ICD-10-CM | POA: Diagnosis not present

## 2020-06-29 DIAGNOSIS — Z124 Encounter for screening for malignant neoplasm of cervix: Secondary | ICD-10-CM | POA: Diagnosis not present

## 2020-06-29 DIAGNOSIS — E162 Hypoglycemia, unspecified: Secondary | ICD-10-CM | POA: Diagnosis not present

## 2020-06-29 DIAGNOSIS — E7800 Pure hypercholesterolemia, unspecified: Secondary | ICD-10-CM | POA: Diagnosis not present

## 2020-06-29 DIAGNOSIS — E559 Vitamin D deficiency, unspecified: Secondary | ICD-10-CM | POA: Diagnosis not present

## 2020-06-29 DIAGNOSIS — Z1389 Encounter for screening for other disorder: Secondary | ICD-10-CM | POA: Diagnosis not present

## 2020-07-04 LAB — CYTOLOGY - PAP
Comment: NEGATIVE
Diagnosis: NEGATIVE
High risk HPV: NEGATIVE

## 2020-07-27 NOTE — Progress Notes (Signed)
Date:  07/28/2020   ID:  Amanda Bentley, DOB 01/25/1955, MRN [id number]  PCP:  Amanda Ada, MD  Cardiologist:  Amanda Kras, DO, [facility]  (established care 07/27/2020) Former Cardiology Providers: Amanda Brick, MD   REASON FOR CONSULT: Left bundle-branch block  REQUESTING PHYSICIAN:  Amanda Ada, MD [facility],  [place] [address]  Chief Complaint  Patient presents with   LBBB   New Patient (Initial Visit)    Referred by Amanda Ada, MD    HPI  Amanda Bentley is a 65 y.o. female who presents to the office with a chief complaint of "shortness of breath." Patient's past medical history and cardiovascular risk factors include: LBBB, hypertension, prediabetes, osteoarthritis, carpal tunnel syndrome, postmenopausal female, advanced age, obesity due to excess calories.  She is referred to the office at the request of Amanda Ada, MD for evaluation of left bundle-branch block.  Patient states that she has a history of left bundle branch block which is chronic and stable.  She has been under a lot of stress as of her husband is currently having progressive Parkinson's disease enrolled into hospice has to provide significant amount of care to him.  Recently has been experiencing shortness of breath and palpitations.  Shortness of breath mostly brought on by effort related activities such as walking long distances or overexerting herself doing yard work.  The symptoms usually improve after resting.  She denies orthopnea, paroxysmal nocturnal dyspnea or lower extremity swelling.  Patient states that she does not check her blood pressures at home on a regular basis.  When she does it usually during the evening hours after taking her medications and her systolic blood pressures are usually around 130-140 mmHg.  She states that her numbers are usually higher in the morning but chooses not to check them.  She denies any significant intake of  canned goods.  But due to a hectic lifestyle they " pick up foods/eat out."  She takes all her antihypertensive medications in the morning.  With regards to palpitations: Mostly present/noticeable at night.  They last for a few seconds, and self-limited.  No improving or worsening factors.  She has been on beta-blocker therapy for the last several years.  FUNCTIONAL STATUS: Enjoys walking and very active w/ yardwork and gardening. No structured exercise program or daily routine.   ALLERGIES: Allergies  Allergen Reactions   Iohexol      Desc: pt had swelling from iv contrast yrs ago. pt also does not like to take prednisone for it's side effects. she experiences facial tingling and the benadryl exacerbates this. did fine w/ all premeds and contrast., Onset Date: [id number]     MEDICATION LIST PRIOR TO VISIT: Current Meds  Medication Sig   ALPRAZolam (XANAX) 0.5 MG tablet Take 0.5 mg by mouth as needed.   hydrochlorothiazide (HYDRODIURIL) 25 MG tablet Take 1 tablet (25 mg total) by mouth every morning.   losartan (COZAAR) 100 MG tablet Take 50 mg by mouth daily.   metoprolol tartrate (LOPRESSOR) 50 MG tablet Take 50 mg by mouth 2 (two) times daily.   VITAMIN D PO Take 1 tablet by mouth daily at 12 noon.   [DISCONTINUED] metoprolol succinate (TOPROL-XL) 100 MG 24 hr tablet Take 50 mg by mouth in the morning and at bedtime. Take with or immediately following a meal.     PAST MEDICAL HISTORY: Past Medical History:  Diagnosis Date   Complication of anesthesia    problems  waking up-3/15   Endometriosis    GERD (gastroesophageal reflux disease)    Hypertension    LBBB (left bundle branch block)    PONV (postoperative nausea and vomiting)     PAST SURGICAL HISTORY: Past Surgical History:  Procedure Laterality Date   CARPAL TUNNEL RELEASE Right 03/31/2013   Procedure: RIGHT CARPAL TUNNEL RELEASE;  Surgeon: Cammie Sickle., MD;  Location: [place] [facility];  Service:  Orthopedics;  Laterality: Right;   CARPAL TUNNEL RELEASE Left 01/12/2014   Procedure: LEFT CARPAL TUNNEL RELEASE;  Surgeon: Daryll Brod, MD;  Location: [facility];  Service: Orthopedics;  Laterality: Left;   COLONOSCOPY     DILATION AND CURETTAGE OF UTERUS     ERCP     EYE SURGERY     lt cataract 12/15   EYE SURGERY     rt cataract 01/06/14   LAPAROSCOPY  2008,2000   explor lap   UPPER GI ENDOSCOPY      FAMILY HISTORY: The patient family history includes Breast cancer in her maternal grandmother; Lung disease in her mother; Stomach cancer in her sister. No family history of premature coronary disease or sudden cardiac death.  SOCIAL HISTORY:  The patient  reports that she has never smoked. She has never used smokeless tobacco. She reports that she does not drink alcohol and does not use drugs.  REVIEW OF SYSTEMS: Review of Systems  Constitutional: Negative for chills and fever.  HENT:  Negative for hoarse voice and nosebleeds.   Eyes:  Negative for discharge, double vision and pain.  Cardiovascular:  Positive for dyspnea on exertion and palpitations. Negative for chest pain, claudication, leg swelling, near-syncope, orthopnea, paroxysmal nocturnal dyspnea and syncope.  Respiratory:  Positive for shortness of breath. Negative for hemoptysis.   Musculoskeletal:  Negative for muscle cramps and myalgias.  Gastrointestinal:  Negative for abdominal pain, constipation, diarrhea, hematemesis, hematochezia, melena, nausea and vomiting.  Neurological:  Negative for dizziness and light-headedness.   PHYSICAL EXAM: Vitals with BMI 07/28/2020 07/28/2020 11/10/2019  Height - _0  _1   Weight - 238 lbs 226 lbs  BMI - 83.41 96.22  Systolic 297 989 211  Diastolic 82 93 95  Pulse 69 73 -    CONSTITUTIONAL: Well-developed and well-nourished. No acute distress.  SKIN: Skin is warm and dry. No rash noted. No cyanosis. No pallor. No jaundice HEAD: Normocephalic and atraumatic.   EYES: No scleral icterus MOUTH/THROAT: Moist oral membranes.  NECK: No JVD present. No thyromegaly noted. No carotid bruits  LYMPHATIC: No visible cervical adenopathy.  CHEST Normal respiratory effort. No intercostal retractions  LUNGS: Clear to auscultation bilaterally.  No stridor. No wheezes. No rales.  CARDIOVASCULAR: Regular, positive S1-S2, no murmurs rubs or gallops appreciated. ABDOMINAL: Obese, soft, nontender, nondistended, positive bowel sounds in all 4 quadrants.  No apparent ascites.  EXTREMITIES: +1 bilateral peripheral edema, warm to touch HEMATOLOGIC: No significant bruising NEUROLOGIC: Oriented to person, place, and time. Nonfocal. Normal muscle tone.  PSYCHIATRIC: Normal mood and affect. Normal behavior. Cooperative  CARDIAC DATABASE: EKG: 07/28/2020: Normal sinus rhythm, 60 bpm, left axis deviation, left bundle branch block.    Echocardiogram: No results found for this or any previous visit from the past 1095 days.   Stress Testing: No results found for this or any previous visit from the past 1095 days.  Heart Catheterization: None  LABORATORY DATA: CBC Latest Ref Rng & Units 01/25/2019 01/12/2014 03/31/2013  WBC 4.0 - 10.5 K/uL 10.3 - -  Hemoglobin 12.0 - 15.0 g/dL 15.1(H) 14.6 15.4(H)  Hematocrit 36.0 - 46.0 % 44.7 43.0 -  Platelets 150 - 400 K/uL 289 - -    CMP Latest Ref Rng & Units 01/25/2019 01/12/2014 03/27/2013  Glucose 70 - 99 mg/dL 119(H) 104(H) 106(H)  BUN 8 - 23 mg/dL _0 Creatinine 0.44 - 1.00 mg/dL 0.75 0.70 0.65  Sodium 135 - 145 mmol/L 139 142 144  Potassium 3.5 - 5.1 mmol/L 3.5 3.5 3.8  Chloride 98 - 111 mmol/L 104 104 103  CO2 22 - 32 mmol/L 23 - 26  Calcium 8.9 - 10.3 mg/dL 9.2 - 9.5    Lipid Panel  No results found for: CHOL, TRIG, HDL, CHOLHDL, VLDL, LDLCALC, LDLDIRECT, LABVLDL  No components found for: NTPROBNP No results for input(s): PROBNP in the last 8760 hours. No results for input(s): TSH in the last 8760  hours.  BMP No results for input(s): NA, K, CL, CO2, GLUCOSE, BUN, CREATININE, CALCIUM, GFRNONAA, GFRAA in the last 8760 hours.  HEMOGLOBIN A1C No results found for: HGBA1C, MPG  External Labs: Collected: 06/29/2020 provided by PCP Creatinine 0.69 mg/dL. eGFR: 96 mL/min per 1.73 m Sodium 141, potassium 3.9, chloride 103, bicarb 27 AST 19, ALT 21, alkaline phosphatase 61 Hemoglobin 15 g/dL, hematocrit 45% Total cholesterol 170, triglycerides 170, HDL 39, LDL 101, non-HDL 131 TSH 3.02 Hemoglobin A1c 5.9  IMPRESSION:    ICD-10-CM   1. LBBB (left bundle branch block)  I44.7 EKG 12-Lead    PCV ECHOCARDIOGRAM COMPLETE    2. Dyspnea on exertion  R06.00 hydrochlorothiazide (HYDRODIURIL) 25 MG tablet    Basic metabolic panel    Magnesium    Pro b natriuretic peptide (BNP)    3. Benign hypertension  I10 hydrochlorothiazide (HYDRODIURIL) 25 MG tablet    Basic metabolic panel    Magnesium    4. Class 2 obesity due to excess calories without serious comorbidity with body mass index (BMI) of 37.0 to 37.9 in adult  E66.09    Z68.37        RECOMMENDATIONS: Sydnee Lamour is a 65 y.o. female whose past medical history and cardiac risk factors include: LBBB, hypertension, prediabetes, osteoarthritis, carpal tunnel syndrome, postmenopausal female, advanced age, obesity due to excess calories.  Dyspnea on exertion: I suspect most likely secondary to uncontrolled hypertension. Will start hydrochlorothiazide 25 mg p.o. every morning. Patient is asked to take Lopressor 50 mg p.o. twice daily as prescribed originally. Instructed to take losartan at night. Blood work in 1 week to evaluate kidney function and electrolytes. Echocardiogram will be ordered to evaluate for structural heart disease and left ventricular systolic function. Once the blood pressure is better controlled would recommend considering ischemic evaluation to prevent false positive studies.  Her 10-year  estimated risk of ASCVD is approximately 10%.  I suspect this is most likely secondary to her elevated blood pressures.  We will reevaluate this at the next office visit.  Most recent lab work provided by PCP independently reviewed.  Patient educated on the importance of decreasing cholesterol rich foods.  May consider coronary calcium score for further risk stratification.  Benign essential hypertension: Office blood pressures are not well controlled. Patient does not check her home blood pressures frequently. Medications reconciled and changes noted above Low-salt diet recommended which includes decreasing the consumption of outside food such as takeout.  Obesity, due to excess calories: Body mass index is 37.28 kg/m. I reviewed with the patient the importance of diet, regular physical  activity/exercise, weight loss.   Patient is educated on increasing physical activity gradually as tolerated.  With the goal of moderate intensity exercise for 30 minutes a day 5 days a week.   FINAL MEDICATION LIST END OF ENCOUNTER: Meds ordered this encounter  Medications   hydrochlorothiazide (HYDRODIURIL) 25 MG tablet    Sig: Take 1 tablet (25 mg total) by mouth every morning.    Dispense:  90 tablet    Refill:  0     Medications Discontinued During This Encounter  Medication Reason   tiZANidine (ZANAFLEX) 2 MG tablet Error   escitalopram (LEXAPRO) 10 MG tablet Error   celecoxib (CELEBREX) 200 MG capsule Error   amLODipine (NORVASC) 5 MG tablet Error   metoprolol succinate (TOPROL-XL) 100 MG 24 hr tablet Entry Error     Current Outpatient Medications:    ALPRAZolam (XANAX) 0.5 MG tablet, Take 0.5 mg by mouth as needed., Disp: , Rfl:    hydrochlorothiazide (HYDRODIURIL) 25 MG tablet, Take 1 tablet (25 mg total) by mouth every morning., Disp: 90 tablet, Rfl: 0   losartan (COZAAR) 100 MG tablet, Take 50 mg by mouth daily., Disp: , Rfl:    metoprolol tartrate (LOPRESSOR) 50 MG tablet, Take 50 mg  by mouth 2 (two) times daily., Disp: , Rfl:    VITAMIN D PO, Take 1 tablet by mouth daily at 12 noon., Disp: , Rfl:   Orders Placed This Encounter  Procedures   Basic metabolic panel   Magnesium   Pro b natriuretic peptide (BNP)   EKG 12-Lead   PCV ECHOCARDIOGRAM COMPLETE    There are no Patient Instructions on file for this visit.   --Continue cardiac medications as reconciled in final medication list. --Return in about 4 weeks (around 08/25/2020) for Follow up, Dyspnea, Review test results. Or sooner if needed. --Continue follow-up with your primary care physician regarding the management of your other chronic comorbid conditions.  Patient's questions and concerns were addressed to her satisfaction. She voices understanding of the instructions provided during this encounter.   This note was created using a voice recognition software as a result there may be grammatical errors inadvertently enclosed that do not reflect the nature of this encounter. Every attempt is made to correct such errors.  Amanda Bentley, [place], [facility]  Pager: [phone] Office: [phone]

## 2020-07-28 ENCOUNTER — Encounter: Payer: Self-pay | Admitting: Cardiology

## 2020-07-28 ENCOUNTER — Ambulatory Visit: Payer: Medicare HMO | Admitting: Cardiology

## 2020-07-28 ENCOUNTER — Other Ambulatory Visit: Payer: Self-pay

## 2020-07-28 VITALS — BP 150/82 | HR 69 | Temp 98.4°F | Resp 16 | Ht 67.0 in | Wt 238.0 lb

## 2020-07-28 DIAGNOSIS — Z6837 Body mass index (BMI) 37.0-37.9, adult: Secondary | ICD-10-CM | POA: Diagnosis not present

## 2020-07-28 DIAGNOSIS — I10 Essential (primary) hypertension: Secondary | ICD-10-CM | POA: Diagnosis not present

## 2020-07-28 DIAGNOSIS — E6609 Other obesity due to excess calories: Secondary | ICD-10-CM

## 2020-07-28 DIAGNOSIS — R0609 Other forms of dyspnea: Secondary | ICD-10-CM

## 2020-07-28 DIAGNOSIS — I447 Left bundle-branch block, unspecified: Secondary | ICD-10-CM | POA: Diagnosis not present

## 2020-07-28 DIAGNOSIS — R0600 Dyspnea, unspecified: Secondary | ICD-10-CM

## 2020-07-28 MED ORDER — HYDROCHLOROTHIAZIDE 25 MG PO TABS: 25.0000 mg | ORAL_TABLET | Freq: Every morning | ORAL | 0 refills | Status: DC

## 2020-08-02 ENCOUNTER — Other Ambulatory Visit: Payer: Medicare HMO

## 2020-08-02 ENCOUNTER — Ambulatory Visit: Payer: Self-pay | Admitting: Cardiology

## 2020-08-02 ENCOUNTER — Other Ambulatory Visit: Payer: Self-pay

## 2020-08-02 DIAGNOSIS — R0600 Dyspnea, unspecified: Secondary | ICD-10-CM

## 2020-08-02 DIAGNOSIS — I10 Essential (primary) hypertension: Secondary | ICD-10-CM

## 2020-08-02 DIAGNOSIS — R0609 Other forms of dyspnea: Secondary | ICD-10-CM

## 2020-08-09 ENCOUNTER — Ambulatory Visit: Payer: Medicare HMO

## 2020-08-09 ENCOUNTER — Other Ambulatory Visit: Payer: Self-pay

## 2020-08-09 DIAGNOSIS — I447 Left bundle-branch block, unspecified: Secondary | ICD-10-CM

## 2020-08-18 NOTE — Progress Notes (Signed)
Called pt to inform her about her echo results. Pt understood.

## 2020-08-23 DIAGNOSIS — J22 Unspecified acute lower respiratory infection: Secondary | ICD-10-CM | POA: Diagnosis not present

## 2020-08-24 DIAGNOSIS — U071 COVID-19: Secondary | ICD-10-CM | POA: Diagnosis not present

## 2020-08-24 DIAGNOSIS — R059 Cough, unspecified: Secondary | ICD-10-CM | POA: Diagnosis not present

## 2020-08-24 DIAGNOSIS — R5383 Other fatigue: Secondary | ICD-10-CM | POA: Diagnosis not present

## 2020-08-24 DIAGNOSIS — J22 Unspecified acute lower respiratory infection: Secondary | ICD-10-CM | POA: Diagnosis not present

## 2020-08-26 ENCOUNTER — Ambulatory Visit: Payer: Medicare HMO | Admitting: Cardiology

## 2020-08-29 ENCOUNTER — Ambulatory Visit: Payer: Medicare HMO | Admitting: Cardiology

## 2020-09-01 DIAGNOSIS — U071 COVID-19: Secondary | ICD-10-CM | POA: Diagnosis not present

## 2020-09-23 ENCOUNTER — Ambulatory Visit: Payer: Medicare HMO | Admitting: Cardiology

## 2020-10-25 ENCOUNTER — Ambulatory Visit: Payer: Medicare HMO | Admitting: Cardiology

## 2020-11-22 ENCOUNTER — Ambulatory Visit: Payer: Medicare HMO | Admitting: Cardiology

## 2020-11-30 DIAGNOSIS — K219 Gastro-esophageal reflux disease without esophagitis: Secondary | ICD-10-CM | POA: Diagnosis not present

## 2020-11-30 DIAGNOSIS — Z1211 Encounter for screening for malignant neoplasm of colon: Secondary | ICD-10-CM | POA: Diagnosis not present

## 2020-12-12 DIAGNOSIS — M791 Myalgia, unspecified site: Secondary | ICD-10-CM | POA: Diagnosis not present

## 2020-12-12 DIAGNOSIS — R051 Acute cough: Secondary | ICD-10-CM | POA: Diagnosis not present

## 2020-12-12 DIAGNOSIS — J069 Acute upper respiratory infection, unspecified: Secondary | ICD-10-CM | POA: Diagnosis not present

## 2020-12-12 DIAGNOSIS — R0981 Nasal congestion: Secondary | ICD-10-CM | POA: Diagnosis not present

## 2020-12-12 DIAGNOSIS — U071 COVID-19: Secondary | ICD-10-CM | POA: Diagnosis not present

## 2020-12-12 DIAGNOSIS — J101 Influenza due to other identified influenza virus with other respiratory manifestations: Secondary | ICD-10-CM | POA: Diagnosis not present

## 2020-12-12 DIAGNOSIS — R5383 Other fatigue: Secondary | ICD-10-CM | POA: Diagnosis not present

## 2020-12-15 DIAGNOSIS — R051 Acute cough: Secondary | ICD-10-CM | POA: Diagnosis not present

## 2020-12-15 DIAGNOSIS — J101 Influenza due to other identified influenza virus with other respiratory manifestations: Secondary | ICD-10-CM | POA: Diagnosis not present

## 2020-12-15 DIAGNOSIS — J209 Acute bronchitis, unspecified: Secondary | ICD-10-CM | POA: Diagnosis not present

## 2020-12-22 DIAGNOSIS — R058 Other specified cough: Secondary | ICD-10-CM | POA: Diagnosis not present

## 2020-12-22 DIAGNOSIS — J209 Acute bronchitis, unspecified: Secondary | ICD-10-CM | POA: Diagnosis not present

## 2020-12-22 DIAGNOSIS — R0902 Hypoxemia: Secondary | ICD-10-CM | POA: Diagnosis not present

## 2020-12-22 DIAGNOSIS — R062 Wheezing: Secondary | ICD-10-CM | POA: Diagnosis not present

## 2021-01-07 IMAGING — CT CT HEAD W/O CM
4 series · 17 of 47 positions shown, 19 images · non-contrast
Comparison: Facial bone CT dated 05/14/2007

CLINICAL DATA: 63-year-old female with headache.

EXAM:
CT HEAD WITHOUT CONTRAST
TECHNIQUE: Contiguous axial images were obtained from the base of the skull
through the vertex without intravenous contrast.

[Series 3: head wo · axial · 0.43mm/px · z∈[-304,-174]mm · 7 of 36 slices shown, 9 images]
[im 5/36  brain]
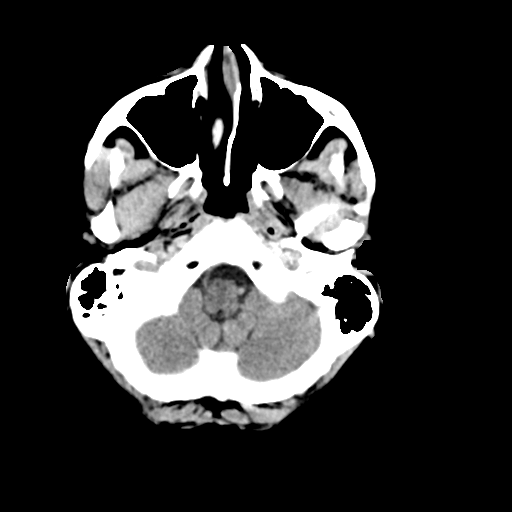
[im 5/36  bone]
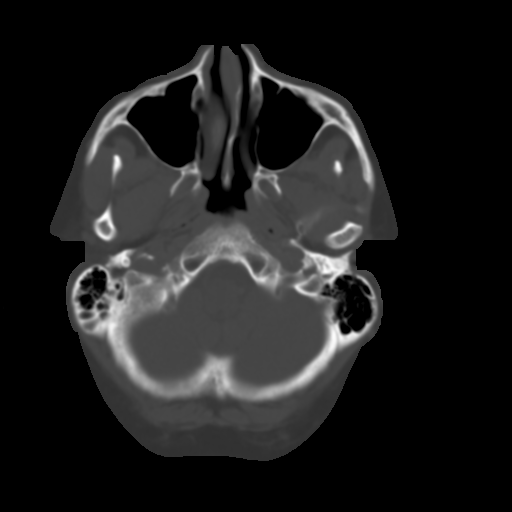
[im 9/36  brain]
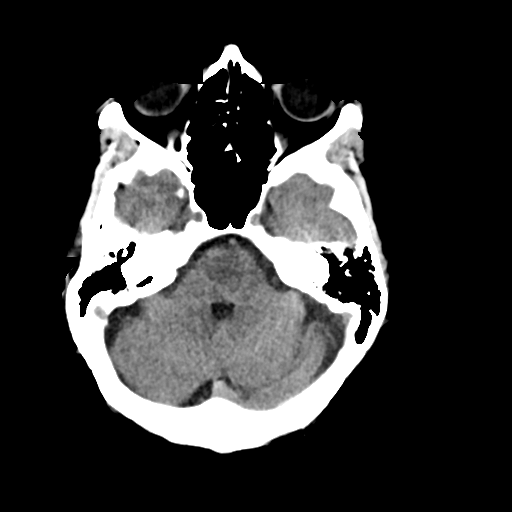
[im 14/36  brain]
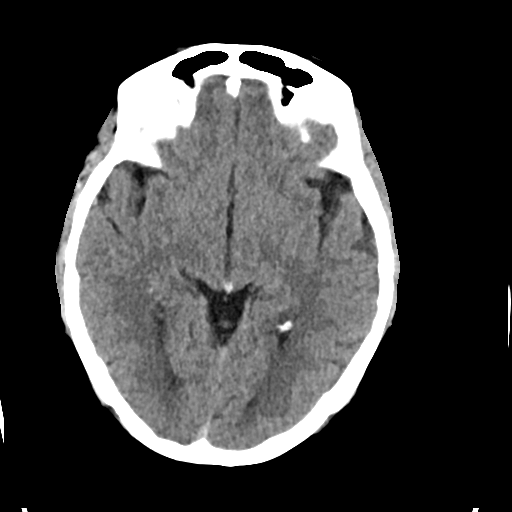
[im 18/36  brain]
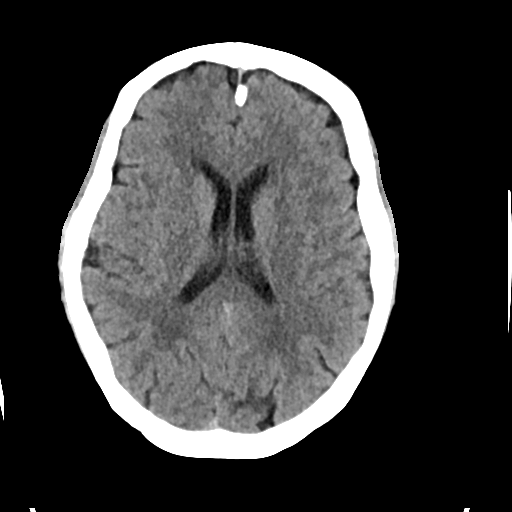
[im 22/36  brain]
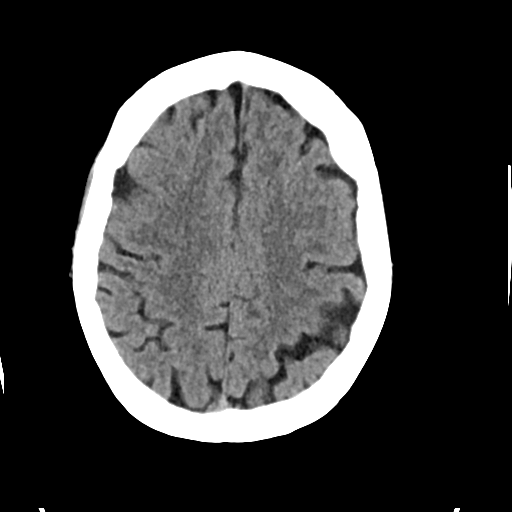
[im 22/36  bone]
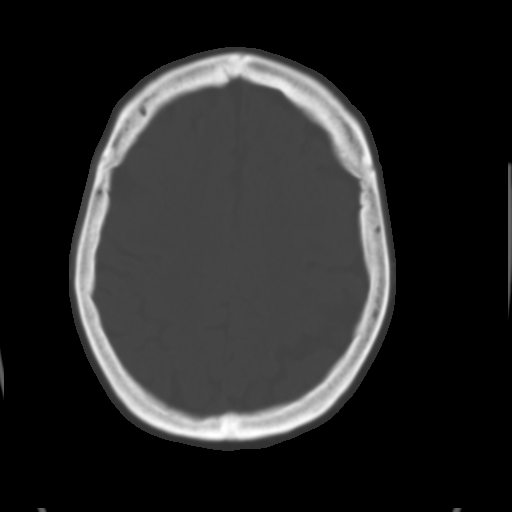
[im 27/36  brain]
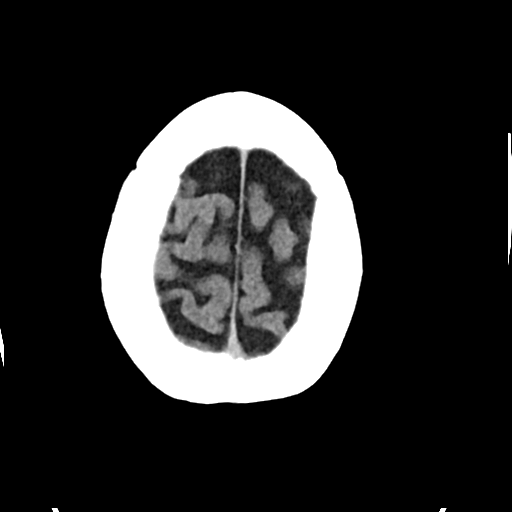
[im 31/36  brain]
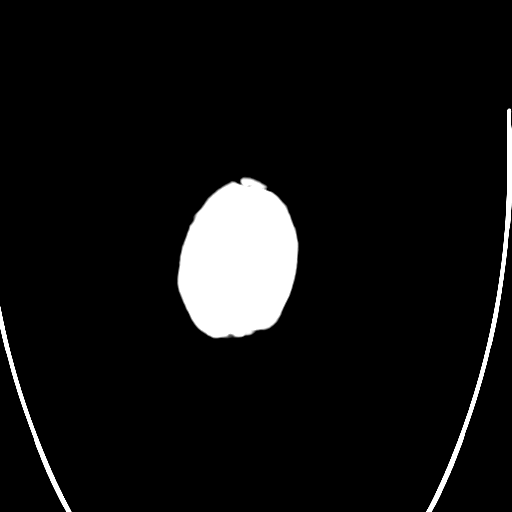

[Series 4: head bone · axial · 0.43mm/px · z∈[-308,-246]mm · 4 of 88 slices shown]
[im 9/88  bone]
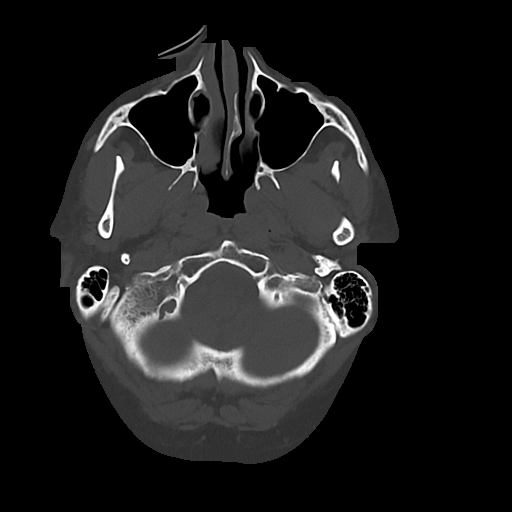
[im 18/88  bone]
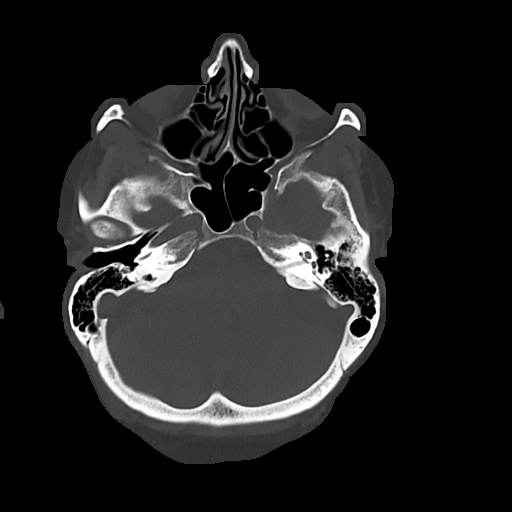
[im 27/88  bone]
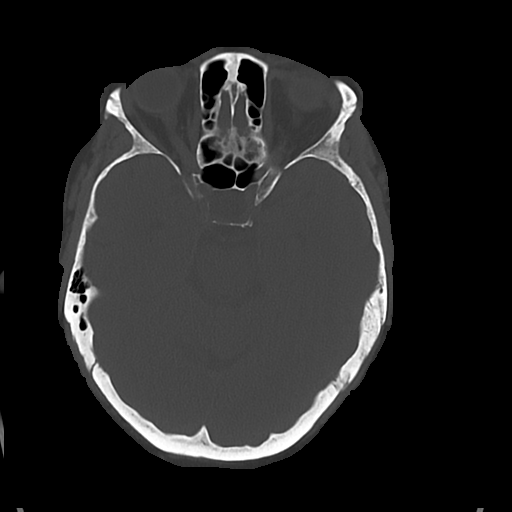
[im 40/88  bone]
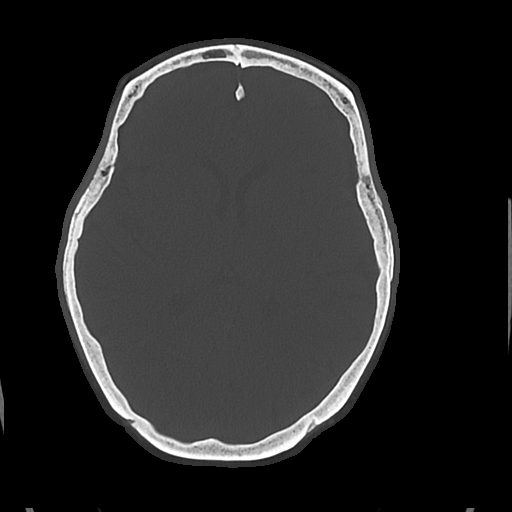

[Series 5: cor soft · coronal · 0.38mm/px · 3 of 70 slices shown]
[im 24/70  brain]
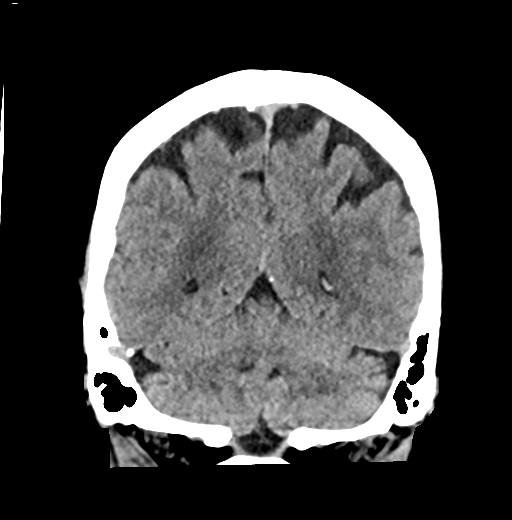
[im 31/70  brain]
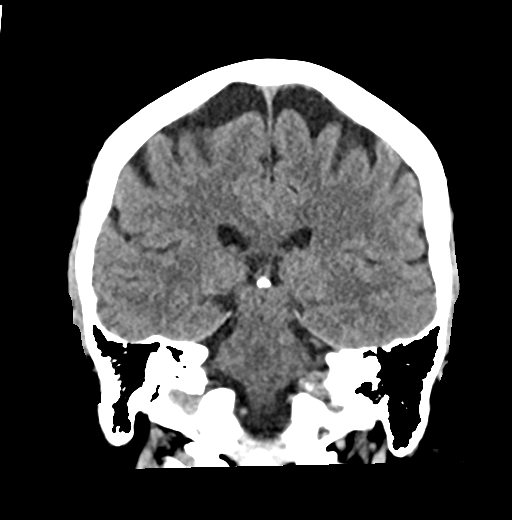
[im 39/70  brain]
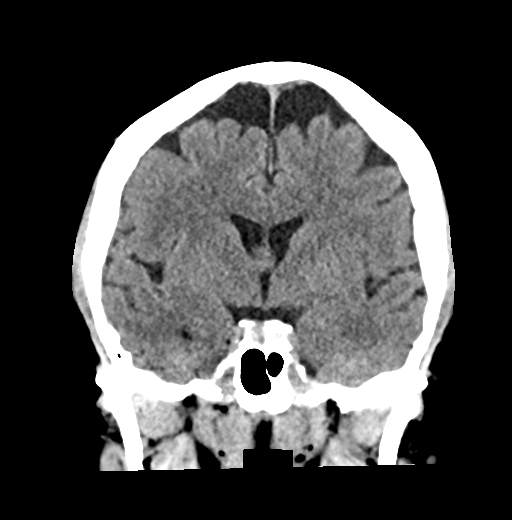

[Series 6: sag soft · sagittal · 0.38mm/px · 3 of 63 slices shown]
[im 21/63  brain]
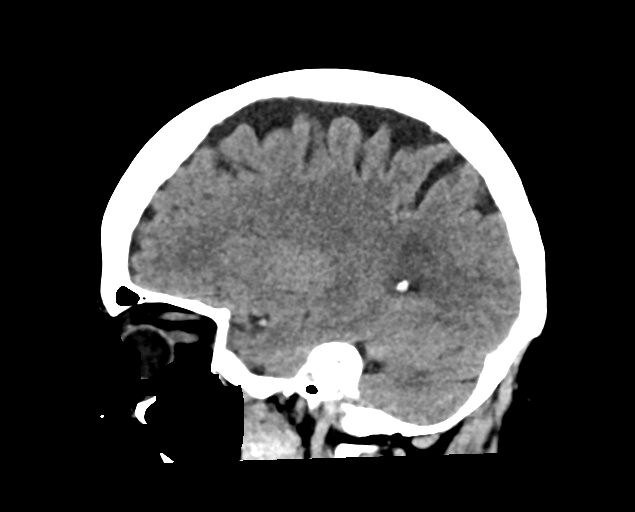
[im 32/63  brain]
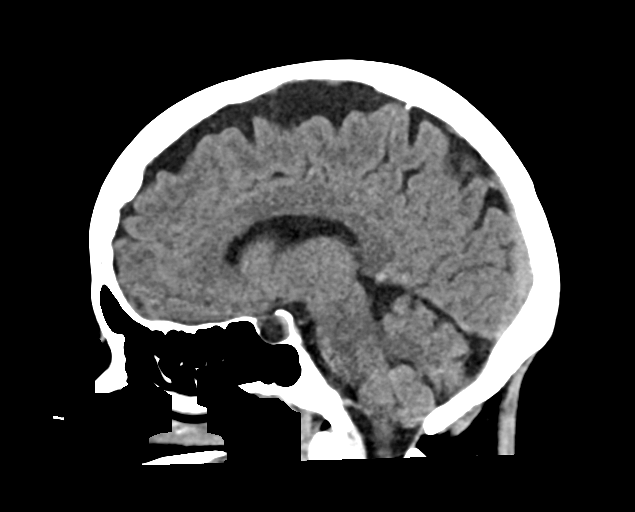
[im 42/63  brain]
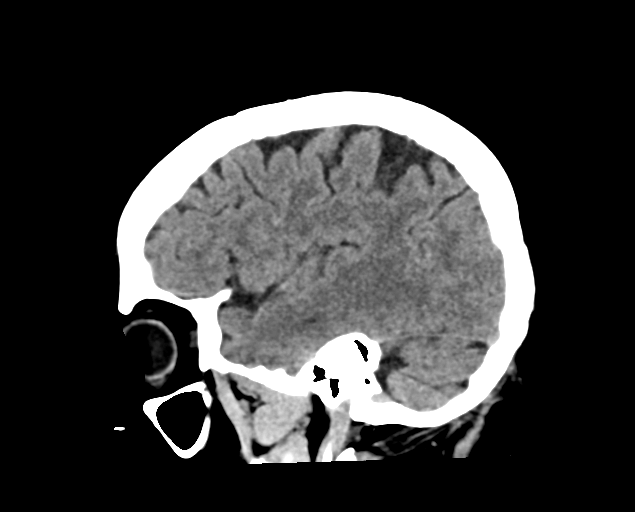

[17 of 47 positions shown; findings below may reference images not displayed]

FINDINGS: Brain: The ventricles and sulci appropriate size for patient's age.
The gray-white matter discrimination is preserved. There is no acute
intracranial hemorrhage. No mass effect or midline shift. No
extra-axial fluid collection. Partially empty sella.

Vascular: No hyperdense vessel or unexpected calcification.

Skull: Normal. Negative for fracture or focal lesion.

Sinuses/Orbits: No acute finding.

Other: None
IMPRESSION: No acute intracranial pathology.

## 2021-01-12 IMAGING — MG DIGITAL SCREENING BILAT W/ TOMO W/ CAD
6 series · 6 of 18 positions shown · non-contrast
Comparison: Previous exam(s).

ACR Breast Density Category a: The breast tissue is almost entirely
fatty.

CLINICAL DATA: Screening.

EXAM:
DIGITAL SCREENING BILATERAL MAMMOGRAM WITH TOMO AND CAD

[L MLO synth-2D]
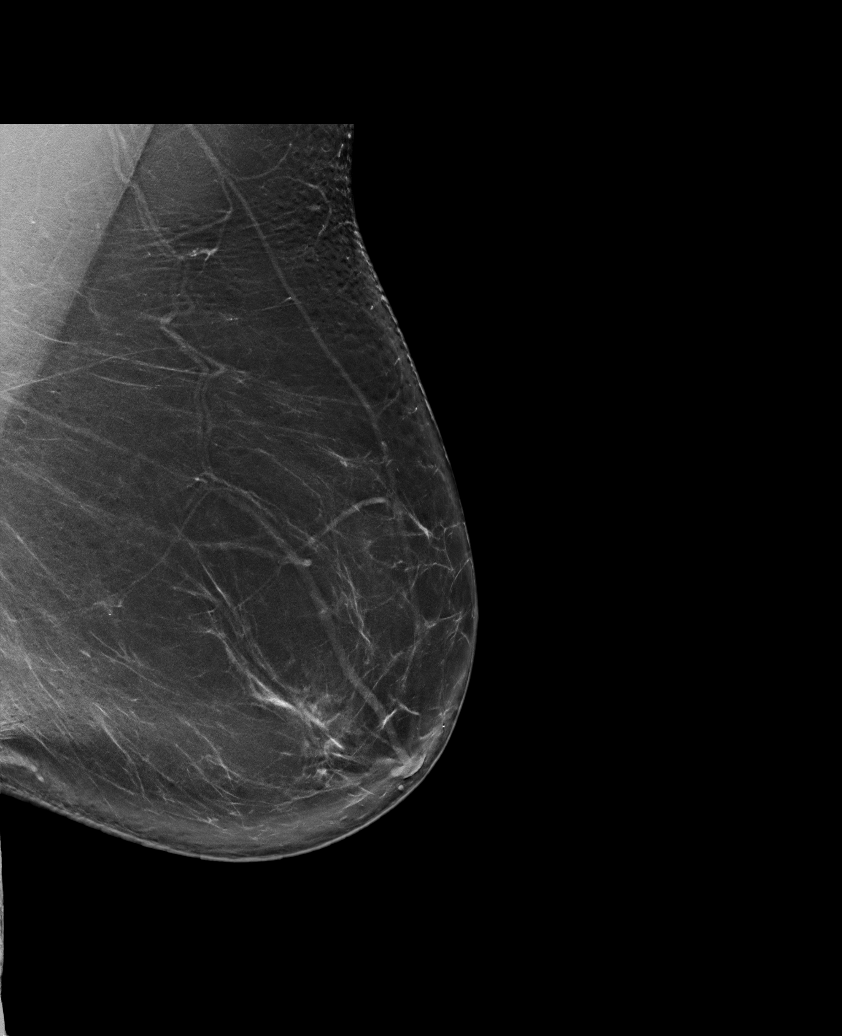

[R MLO synth-2D]
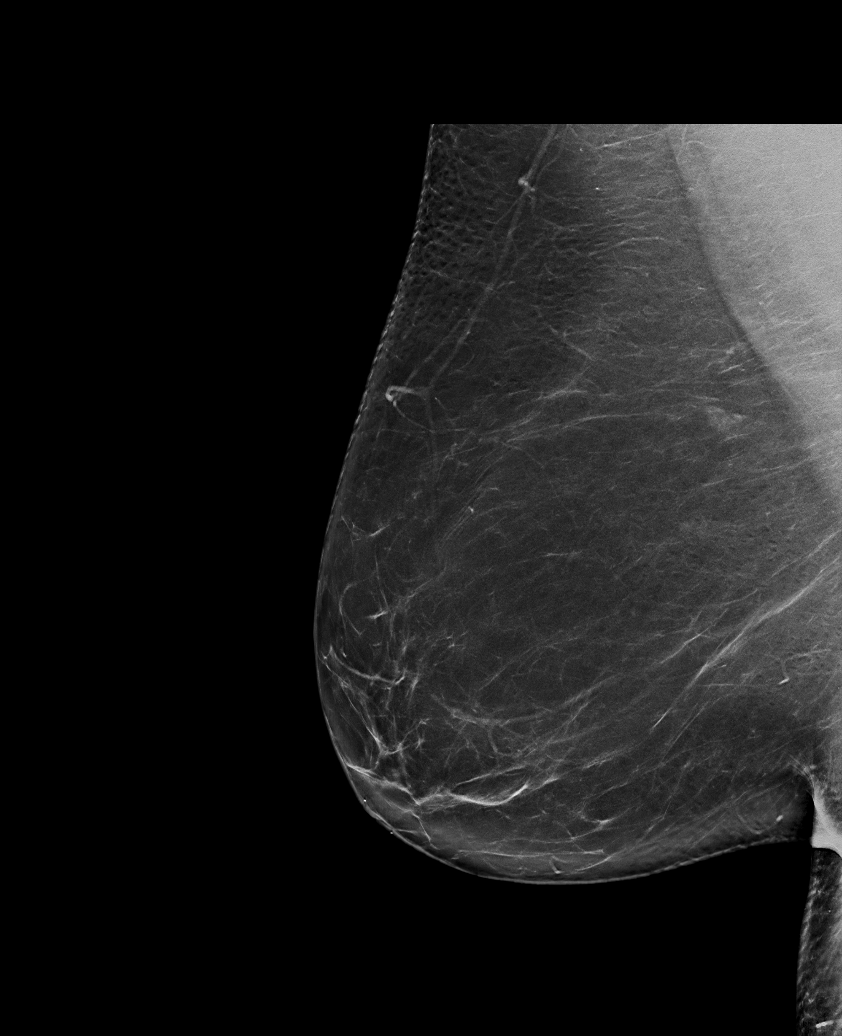

[L CC synth-2D]
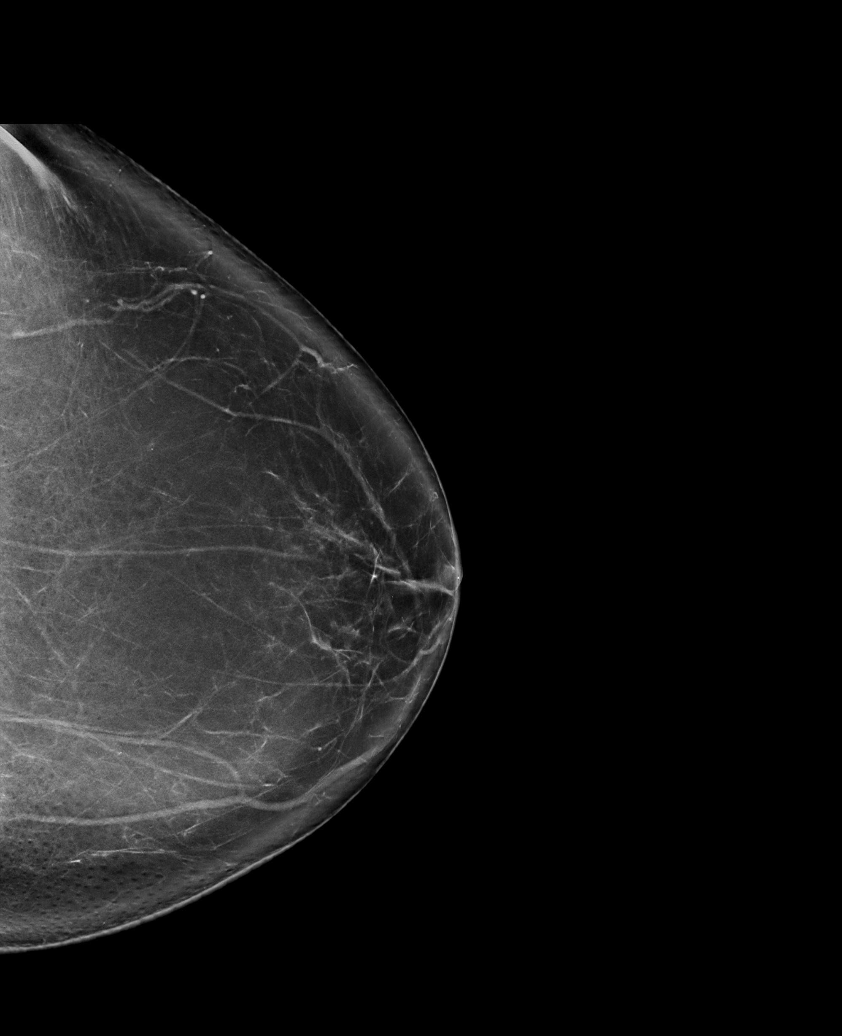

[R MLO tomo · tomo slice 47/93.0]
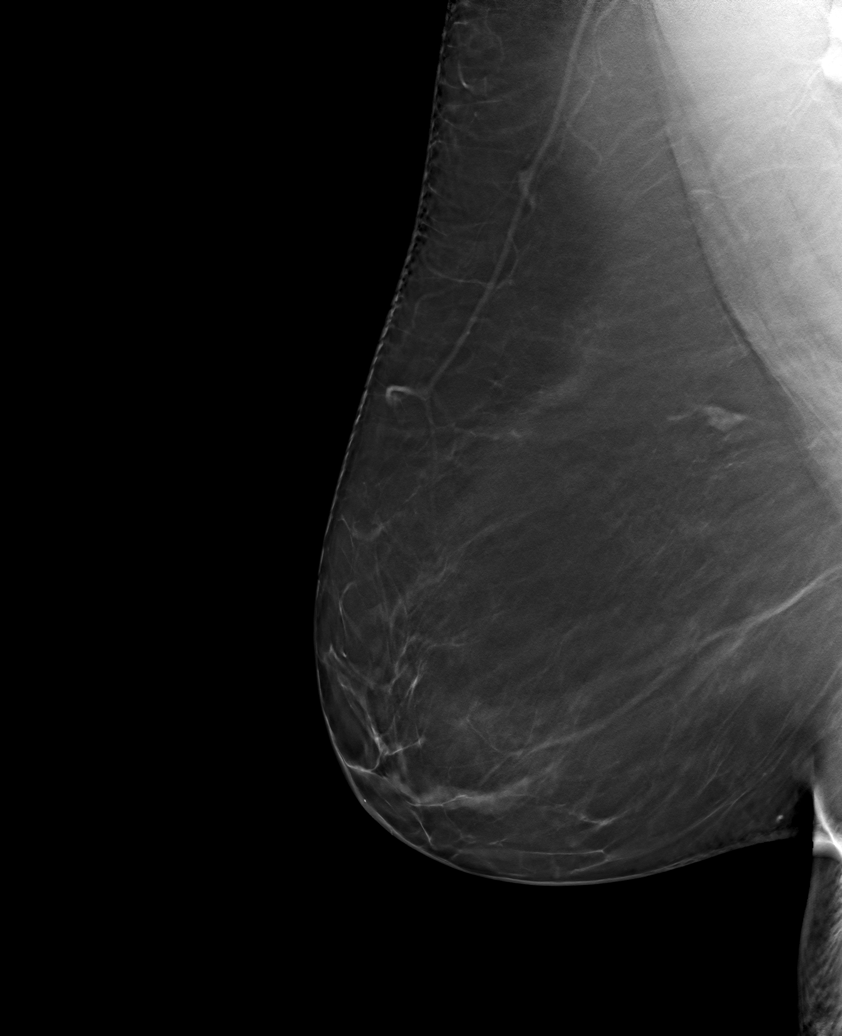

[L MLO tomo · tomo slice 49/96.0]
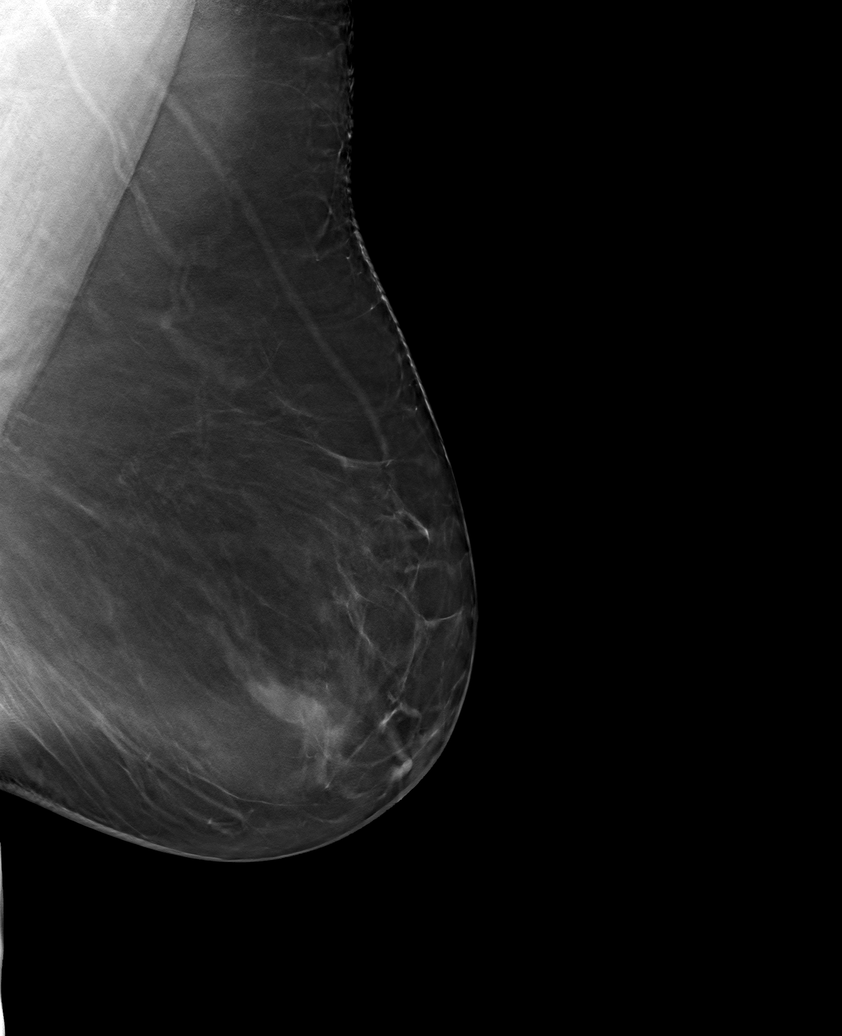

[L CC tomo · tomo slice 44/87.0]
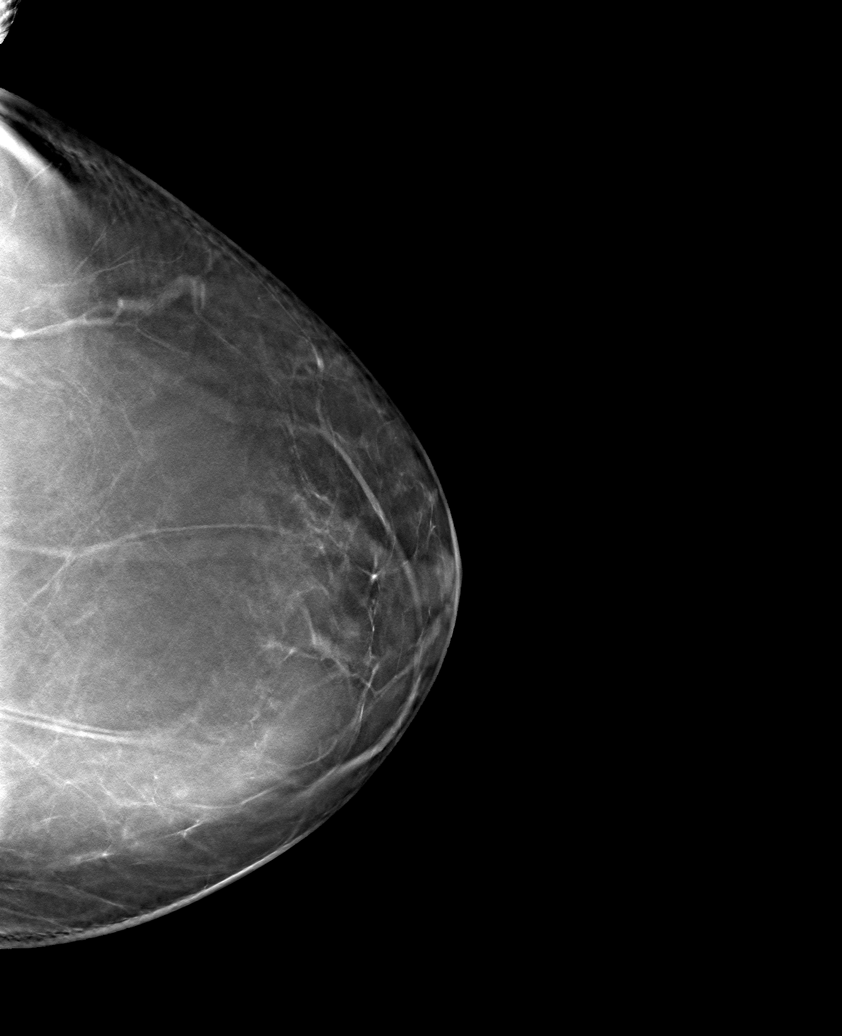

[6 of 18 positions shown; findings below may reference images not displayed]

FINDINGS: There are no findings suspicious for malignancy. Images were
processed with CAD.
IMPRESSION: No mammographic evidence of malignancy. A result letter of this
screening mammogram will be mailed directly to the patient.

RECOMMENDATION:
Screening mammogram in one year. (Code:[id number]-[id number])

BI-RADS CATEGORY  1: Negative.

## 2021-01-20 ENCOUNTER — Ambulatory Visit
Admission: RE | Admit: 2021-01-20 | Discharge: 2021-01-20 | Disposition: A | Discharge status: Home / Self Care | Payer: Medicare Other | Source: Ambulatory Visit | Attending: Physician Assistant | Admitting: Physician Assistant

## 2021-01-20 ENCOUNTER — Other Ambulatory Visit: Payer: Self-pay | Admitting: Physician Assistant

## 2021-01-20 ENCOUNTER — Other Ambulatory Visit: Payer: Self-pay

## 2021-01-20 DIAGNOSIS — R5383 Other fatigue: Secondary | ICD-10-CM

## 2021-01-20 DIAGNOSIS — E559 Vitamin D deficiency, unspecified: Secondary | ICD-10-CM | POA: Diagnosis not present

## 2021-01-20 DIAGNOSIS — R0781 Pleurodynia: Secondary | ICD-10-CM | POA: Diagnosis not present

## 2021-01-20 DIAGNOSIS — R7303 Prediabetes: Secondary | ICD-10-CM | POA: Diagnosis not present

## 2021-01-20 DIAGNOSIS — R053 Chronic cough: Secondary | ICD-10-CM | POA: Diagnosis not present

## 2021-01-24 DIAGNOSIS — R7303 Prediabetes: Secondary | ICD-10-CM | POA: Diagnosis not present

## 2021-01-24 DIAGNOSIS — F439 Reaction to severe stress, unspecified: Secondary | ICD-10-CM | POA: Diagnosis not present

## 2021-01-24 DIAGNOSIS — E7800 Pure hypercholesterolemia, unspecified: Secondary | ICD-10-CM | POA: Diagnosis not present

## 2021-01-24 DIAGNOSIS — I10 Essential (primary) hypertension: Secondary | ICD-10-CM | POA: Diagnosis not present

## 2021-02-08 DIAGNOSIS — M545 Low back pain, unspecified: Secondary | ICD-10-CM | POA: Diagnosis not present

## 2021-03-15 DIAGNOSIS — E559 Vitamin D deficiency, unspecified: Secondary | ICD-10-CM | POA: Diagnosis not present

## 2021-05-17 ENCOUNTER — Other Ambulatory Visit: Payer: Self-pay | Admitting: Family Medicine

## 2021-05-17 DIAGNOSIS — Z1231 Encounter for screening mammogram for malignant neoplasm of breast: Secondary | ICD-10-CM

## 2021-05-22 DIAGNOSIS — Z1211 Encounter for screening for malignant neoplasm of colon: Secondary | ICD-10-CM | POA: Diagnosis not present

## 2021-05-22 DIAGNOSIS — R109 Unspecified abdominal pain: Secondary | ICD-10-CM | POA: Diagnosis not present

## 2021-05-22 DIAGNOSIS — K219 Gastro-esophageal reflux disease without esophagitis: Secondary | ICD-10-CM | POA: Diagnosis not present

## 2021-05-24 DIAGNOSIS — I10 Essential (primary) hypertension: Secondary | ICD-10-CM | POA: Diagnosis not present

## 2021-05-24 DIAGNOSIS — E559 Vitamin D deficiency, unspecified: Secondary | ICD-10-CM | POA: Diagnosis not present

## 2021-05-24 DIAGNOSIS — M6281 Muscle weakness (generalized): Secondary | ICD-10-CM | POA: Diagnosis not present

## 2021-05-24 DIAGNOSIS — R5383 Other fatigue: Secondary | ICD-10-CM | POA: Diagnosis not present

## 2021-05-24 DIAGNOSIS — R0602 Shortness of breath: Secondary | ICD-10-CM | POA: Diagnosis not present

## 2021-05-26 ENCOUNTER — Ambulatory Visit
Admission: RE | Admit: 2021-05-26 | Discharge: 2021-05-26 | Disposition: A | Discharge status: Home / Self Care | Payer: Medicare Other | Source: Ambulatory Visit | Attending: Family Medicine | Admitting: Family Medicine

## 2021-05-26 DIAGNOSIS — Z1231 Encounter for screening mammogram for malignant neoplasm of breast: Secondary | ICD-10-CM | POA: Diagnosis not present

## 2021-06-09 DIAGNOSIS — Z1211 Encounter for screening for malignant neoplasm of colon: Secondary | ICD-10-CM | POA: Diagnosis not present

## 2021-06-18 DIAGNOSIS — K635 Polyp of colon: Secondary | ICD-10-CM | POA: Diagnosis not present

## 2021-07-20 DIAGNOSIS — K219 Gastro-esophageal reflux disease without esophagitis: Secondary | ICD-10-CM | POA: Diagnosis not present

## 2021-07-20 DIAGNOSIS — I10 Essential (primary) hypertension: Secondary | ICD-10-CM | POA: Diagnosis not present

## 2021-07-20 DIAGNOSIS — Z86010 Personal history of colonic polyps: Secondary | ICD-10-CM | POA: Diagnosis not present

## 2021-07-20 DIAGNOSIS — R7303 Prediabetes: Secondary | ICD-10-CM | POA: Diagnosis not present

## 2021-07-20 DIAGNOSIS — R7982 Elevated C-reactive protein (CRP): Secondary | ICD-10-CM | POA: Diagnosis not present

## 2021-07-20 DIAGNOSIS — Z1331 Encounter for screening for depression: Secondary | ICD-10-CM | POA: Diagnosis not present

## 2021-07-20 DIAGNOSIS — R748 Abnormal levels of other serum enzymes: Secondary | ICD-10-CM | POA: Diagnosis not present

## 2021-07-20 DIAGNOSIS — E7800 Pure hypercholesterolemia, unspecified: Secondary | ICD-10-CM | POA: Diagnosis not present

## 2021-07-20 DIAGNOSIS — Z800 Family history of malignant neoplasm of digestive organs: Secondary | ICD-10-CM | POA: Diagnosis not present

## 2021-07-20 DIAGNOSIS — Z0000 Encounter for general adult medical examination without abnormal findings: Secondary | ICD-10-CM | POA: Diagnosis not present

## 2021-07-27 ENCOUNTER — Telehealth: Payer: Self-pay

## 2021-07-28 ENCOUNTER — Other Ambulatory Visit: Payer: Self-pay | Admitting: Cardiology

## 2021-07-28 DIAGNOSIS — I10 Essential (primary) hypertension: Secondary | ICD-10-CM

## 2021-07-28 DIAGNOSIS — R0609 Other forms of dyspnea: Secondary | ICD-10-CM

## 2021-07-28 DIAGNOSIS — E782 Mixed hyperlipidemia: Secondary | ICD-10-CM

## 2021-07-28 DIAGNOSIS — I447 Left bundle-branch block, unspecified: Secondary | ICD-10-CM

## 2021-07-28 DIAGNOSIS — R7982 Elevated C-reactive protein (CRP): Secondary | ICD-10-CM

## 2021-07-28 NOTE — Telephone Encounter (Signed)
External Labs: Collected: 07/20/2021 provided by primary physician Hemoglobin 14.9 g/dL, hematocrit 44.6%.   Platelets 286 BUN 14, creatinine 0.79. Sodium 141, potassium 4, chloride 104, bicarb 28. AST 28, ALT 26, alkaline phosphatase 73 CPK 282 (normal levels 35-165) C-reactive protein, cardiac 5.86 A1c 5.9. Total cholesterol 178, triglycerides 124, HDL 45, LDL 111, non-HDL 133 ESR 20 (upper limits of normal) TSH 2.4  Patient plans to see rheumatology for elevated CPK levels.  I agree with PCP recommendations.  Given her elevated high sensitive CRP, suboptimal lipid control, multiple cardiovascular risk factors including prediabetes and obesity recommend additional cardiovascular testing.  Please schedule an echocardiogram and a coronary calcium score.  Please have her follow-up after the studies are completed for further recommendations.  Deshonna Trnka [place], DO, [facility]

## 2021-07-28 NOTE — Progress Notes (Signed)
External Labs: Collected: 07/20/2021 provided by primary physician Hemoglobin 14.9 g/dL, hematocrit 44.6%.   Platelets 286 BUN 14, creatinine 0.79. Sodium 141, potassium 4, chloride 104, bicarb 28. AST 28, ALT 26, alkaline phosphatase 73 CPK 282 (normal levels 35-165) C-reactive protein, cardiac 5.86 A1c 5.9. Total cholesterol 178, triglycerides 124, HDL 45, LDL 111, non-HDL 133 ESR 20 (upper limits of normal) TSH 2.4   Patient plans to see rheumatology for elevated CPK levels.  I agree with PCP recommendations.   Given her elevated high sensitive CRP, suboptimal lipid control, multiple cardiovascular risk factors including prediabetes and obesity recommend additional cardiovascular testing.   Please schedule an echocardiogram and a coronary calcium score.   Please have her follow-up after the studies are completed for further recommendations.   Orders Placed This Encounter  Procedures   CT CARDIAC SCORING ([facility] LOCATIONS ONLY)    Standing Status:   Future    Standing Expiration Date:   07/29/2022    Order Specific Question:   Preferred imaging location?    Answer:   [facility]    Order Specific Question:   Release to patient    Answer:   Immediate   PCV ECHOCARDIOGRAM COMPLETE    Standing Status:   Future    Standing Expiration Date:   07/29/2022    Rex Kras, DO, [facility]

## 2021-07-31 NOTE — Telephone Encounter (Signed)
See message above, per ST patient needs an echo and coronary calcium score scheduled and have a f/u scheduled after studies are completed

## 2021-08-02 DIAGNOSIS — Z124 Encounter for screening for malignant neoplasm of cervix: Secondary | ICD-10-CM | POA: Diagnosis not present

## 2021-08-02 DIAGNOSIS — Z6838 Body mass index (BMI) 38.0-38.9, adult: Secondary | ICD-10-CM | POA: Diagnosis not present

## 2021-08-02 DIAGNOSIS — F419 Anxiety disorder, unspecified: Secondary | ICD-10-CM | POA: Diagnosis not present

## 2021-08-02 DIAGNOSIS — Z01419 Encounter for gynecological examination (general) (routine) without abnormal findings: Secondary | ICD-10-CM | POA: Diagnosis not present

## 2021-08-04 ENCOUNTER — Other Ambulatory Visit: Payer: Medicare HMO

## 2021-08-04 ENCOUNTER — Other Ambulatory Visit: Payer: Self-pay

## 2021-08-04 ENCOUNTER — Encounter (HOSPITAL_BASED_OUTPATIENT_CLINIC_OR_DEPARTMENT_OTHER): Payer: Self-pay

## 2021-08-04 ENCOUNTER — Emergency Department (HOSPITAL_BASED_OUTPATIENT_CLINIC_OR_DEPARTMENT_OTHER)
Admission: EM | Admit: 2021-08-04 | Discharge: 2021-08-04 | Disposition: A | Discharge status: Home / Self Care | Payer: Medicare Other | Attending: Emergency Medicine | Admitting: Emergency Medicine

## 2021-08-04 DIAGNOSIS — R0602 Shortness of breath: Secondary | ICD-10-CM | POA: Insufficient documentation

## 2021-08-04 DIAGNOSIS — Z79899 Other long term (current) drug therapy: Secondary | ICD-10-CM | POA: Diagnosis not present

## 2021-08-04 DIAGNOSIS — W57XXXA Bitten or stung by nonvenomous insect and other nonvenomous arthropods, initial encounter: Secondary | ICD-10-CM | POA: Diagnosis not present

## 2021-08-04 DIAGNOSIS — T7840XA Allergy, unspecified, initial encounter: Secondary | ICD-10-CM | POA: Diagnosis not present

## 2021-08-04 DIAGNOSIS — D72829 Elevated white blood cell count, unspecified: Secondary | ICD-10-CM | POA: Diagnosis not present

## 2021-08-04 LAB — CBC WITH DIFFERENTIAL/PLATELET
Abs Immature Granulocytes: 0.05 10*3/uL (ref 0.00–0.07)
Basophils Absolute: 0 10*3/uL (ref 0.0–0.1)
Basophils Relative: 0 %
Eosinophils Absolute: 0.1 10*3/uL (ref 0.0–0.5)
Eosinophils Relative: 1 %
HCT: 45.6 % (ref 36.0–46.0)
Hemoglobin: 15.6 g/dL — ABNORMAL HIGH (ref 12.0–15.0)
Immature Granulocytes: 1 %
Lymphocytes Relative: 14 %
Lymphs Abs: 1.5 10*3/uL (ref 0.7–4.0)
MCH: 32.2 pg (ref 26.0–34.0)
MCHC: 34.2 g/dL (ref 30.0–36.0)
MCV: 94.2 fL (ref 80.0–100.0)
Monocytes Absolute: 1 10*3/uL (ref 0.1–1.0)
Monocytes Relative: 9 %
Neutro Abs: 8.3 10*3/uL — ABNORMAL HIGH (ref 1.7–7.7)
Neutrophils Relative %: 75 %
Platelets: 314 10*3/uL (ref 150–400)
RBC: 4.84 MIL/uL (ref 3.87–5.11)
RDW: 12.7 % (ref 11.5–15.5)
WBC: 11 10*3/uL — ABNORMAL HIGH (ref 4.0–10.5)
nRBC: 0 % (ref 0.0–0.2)

## 2021-08-04 LAB — BASIC METABOLIC PANEL
Anion gap: 8 (ref 5–15)
BUN: 11 mg/dL (ref 8–23)
CO2: 24 mmol/L (ref 22–32)
Calcium: 9.1 mg/dL (ref 8.9–10.3)
Chloride: 108 mmol/L (ref 98–111)
Creatinine, Ser: 0.78 mg/dL (ref 0.44–1.00)
GFR, Estimated: >60 mL/min (ref >60)
Glucose, Bld: 116 mg/dL — ABNORMAL HIGH (ref 70–99)
Potassium: 3.6 mmol/L (ref 3.5–5.1)
Sodium: 140 mmol/L (ref 135–145)

## 2021-08-04 MED ORDER — FAMOTIDINE IN NACL 20-0.9 MG/50ML-% IV SOLN
20.0000 mg | Freq: Once | INTRAVENOUS | Status: AC
  Administered 2021-08-04: 20 mg via INTRAVENOUS
  Filled 2021-08-04: qty 50

## 2021-08-04 MED ORDER — KETOROLAC TROMETHAMINE 15 MG/ML IJ SOLN
15.0000 mg | Freq: Once | INTRAMUSCULAR | Status: AC
  Administered 2021-08-04: 15 mg via INTRAVENOUS
  Filled 2021-08-04: qty 1

## 2021-08-04 MED ORDER — LACTATED RINGERS IV BOLUS
1000.0000 mL | Freq: Once | INTRAVENOUS | Status: AC
Start: 2021-08-04 — End: 2021-08-04
  Administered 2021-08-04: 1000 mL via INTRAVENOUS

## 2021-08-04 MED ORDER — METHYLPREDNISOLONE SODIUM SUCC 125 MG IJ SOLR
125.0000 mg | Freq: Once | INTRAMUSCULAR | Status: AC
  Administered 2021-08-04: 125 mg via INTRAVENOUS
  Filled 2021-08-04: qty 2

## 2021-08-04 MED ORDER — DIPHENHYDRAMINE HCL 50 MG/ML IJ SOLN
12.5000 mg | Freq: Once | INTRAMUSCULAR | Status: AC
  Administered 2021-08-04: 12.5 mg via INTRAVENOUS
  Filled 2021-08-04: qty 1

## 2021-08-04 MED ORDER — EPINEPHRINE 0.3 MG/0.3ML IJ SOAJ: 0.3000 mg | INTRAMUSCULAR | 0 refills | Status: DC | PRN

## 2021-08-04 NOTE — ED Notes (Addendum)
Rec Bee Sting on left side of face. Tracheal sounds are clear, BS clear bilaterally. Speech WNL, states it is hard to swallow. Left eye area swollen, vision wnl. Does c/o HA. Skin color WNL. Radial pulses easily palpable

## 2021-08-04 NOTE — Discharge Instructions (Signed)
I am glad you are feeling better here in the ED. Keep taking benadryl and pepcid over the next 24 hours. I have sent you in an epi pen in case you are stung again - please use it if you begin to feel short of breath or feel swelling in your throat or tongue. Drink plenty of fluids and follow up with your PCP

## 2021-08-04 NOTE — ED Triage Notes (Signed)
Patient states she was stung by a wasp today in the face. Swelling noted to left side of face. Patient states she went to take her pills this AM and wasn't able to swallow the pill.

## 2021-08-04 NOTE — ED Provider Notes (Signed)
[facility] EMERGENCY DEPARTMENT Provider Note   CSN: [id number] Arrival date & time: 08/04/21  1017     History  Chief Complaint  Patient presents with   Insect Bite   Allergic Reaction    Amanda Bentley is a 66 y.o. female with history of hypertension, GERD who presents to the emergency department for evaluation of an allergic reaction to a wasp sting to the face yesterday.  Patient states that she was stung on the left side of her face.  That side has now swollen up and she also notes that there is swelling to her throat.  She attempted to swallow pills this morning and was unable to which is why she came to the emergency department.  She also endorses mild shortness of breath.  She has not taken any medications including Benadryl as she states that Benadryl "makes her face go numb".  She denies chest pain, tongue swelling, nausea, vomiting, diarrhea, rash.  No previous history of allergic reactions.   Allergic Reaction      Home Medications Prior to Admission medications   Medication Sig Start Date End Date Taking? Authorizing Provider  EPINEPHrine 0.3 mg/0.3 mL IJ SOAJ injection Inject 0.3 mg into the muscle as needed for anaphylaxis. 08/04/21  Yes Kathe Becton R, PA-C  ALPRAZolam Duanne Moron) 0.5 MG tablet Take 0.5 mg by mouth as needed.    [provider]  hydrochlorothiazide (HYDRODIURIL) 25 MG tablet Take 1 tablet (25 mg total) by mouth every morning. 07/28/20 10/26/20  Tolia, Sunit, DO  losartan (COZAAR) 100 MG tablet Take 50 mg by mouth daily.    [provider]  metoprolol tartrate (LOPRESSOR) 50 MG tablet Take 50 mg by mouth 2 (two) times daily.    [provider]  VITAMIN D PO Take 1 tablet by mouth daily at 12 noon.    [provider]      Allergies    Benadryl [diphenhydramine] and Iohexol    Review of Systems   Review of Systems  Constitutional:  Negative for fever.  HENT:  Positive for facial swelling.    Respiratory:  Positive for shortness of breath.   Cardiovascular:  Negative for chest pain.  Gastrointestinal:  Negative for nausea and vomiting.  Musculoskeletal:  Positive for neck pain.  Skin:  Positive for color change.  Neurological:  Negative for headaches.    Physical Exam Updated Vital Signs BP (!) 165/74 (BP Location: Left Arm)   Pulse 68   Temp 98.2 F (36.8 C) (Oral)   Resp 18   Ht 5\' 6"  (1.676 m)   Wt 107 kg   SpO2 93%   BMI 38.09 kg/m  Physical Exam Vitals and nursing note reviewed.  Constitutional:      General: She is not in acute distress.    Appearance: She is not ill-appearing.  HENT:     Head: Atraumatic.     Comments: Swelling noted to the left eye, with overall left-sided facial swelling    Mouth/Throat:     Comments: Some swelling noted to the bilateral tonsils, uvula midline.  Airway intact Eyes:     Conjunctiva/sclera: Conjunctivae normal.  Neck:     Comments: Tenderness and swelling noted under the mandible and into the anterior neck.  Full range of motion. Cardiovascular:     Rate and Rhythm: Normal rate and regular rhythm.     Pulses: Normal pulses.     Heart sounds: No murmur heard. Pulmonary:     Effort:  Pulmonary effort is normal. No respiratory distress.     Breath sounds: Normal breath sounds.     Comments: Lungs CTA bilaterally.  Patient is speaking in full and complete sentences without increased respiratory effort Abdominal:     General: Abdomen is flat. There is no distension.     Palpations: Abdomen is soft.     Tenderness: There is no abdominal tenderness.  Musculoskeletal:        General: Normal range of motion.     Cervical back: Normal range of motion.  Skin:    General: Skin is warm and dry.     Capillary Refill: Capillary refill takes less than 2 seconds.     Comments: No urticaria  Neurological:     General: No focal deficit present.     Mental Status: She is alert.  Psychiatric:        Mood and Affect: Mood  normal.     ED Results / Procedures / Treatments   Labs (all labs ordered are listed, but only abnormal results are displayed) Labs Reviewed  CBC WITH DIFFERENTIAL/PLATELET - Abnormal; Notable for the following components:      Result Value   WBC 11.0 (*)    Hemoglobin 15.6 (*)    Neutro Abs 8.3 (*)    All other components within normal limits  BASIC METABOLIC PANEL - Abnormal; Notable for the following components:   Glucose, Bld 116 (*)    All other components within normal limits    EKG None  Radiology No results found.  Procedures Procedures    Medications Ordered in ED Medications  diphenhydrAMINE (BENADRYL) injection 12.5 mg (12.5 mg Intravenous Given 08/04/21 1111)  famotidine (PEPCID) IVPB 20 mg premix (0 mg Intravenous Stopped 08/04/21 1119)  methylPREDNISolone sodium succinate (SOLU-MEDROL) 125 mg/2 mL injection 125 mg (125 mg Intravenous Given 08/04/21 1110)  ketorolac (TORADOL) 15 MG/ML injection 15 mg (15 mg Intravenous Given 08/04/21 1111)  lactated ringers bolus 1,000 mL ( Intravenous Stopped 08/04/21 1229)    ED Course/ Medical Decision Making/ A&P                           Medical Decision Making Amount and/or Complexity of Data Reviewed Labs: ordered.  Risk Prescription drug management.   Social determinants of health:  Social History   Socioeconomic History   Marital status: Married    Spouse name: Not on file   Number of children: Not on file   Years of education: Not on file   Highest education level: Not on file  Occupational History   Not on file  Tobacco Use   Smoking status: Never   Smokeless tobacco: Never  Vaping Use   Vaping Use: Never used  Substance and Sexual Activity   Alcohol use: No   Drug use: No   Sexual activity: Not on file  Other Topics Concern   Not on file  Social History Narrative   Not on file   Social Determinants of Health   Financial Resource Strain: Not on file  Food Insecurity: Not on file   Transportation Needs: Not on file  Physical Activity: Not on file  Stress: Not on file  Social Connections: Not on file  Intimate Partner Violence: Not on file     Initial impression:  This patient presents to the ED for concern of allergic reaction, this involves an extensive number of treatment options, and is a complaint that carries with it a  high risk of complications and morbidity.   Differentials include allergic reaction, anaphylaxis, contact dermatitis,   Comorbidities affecting care:  Per HPI  Additional history obtained: son  Lab Tests  I Ordered, reviewed, and interpreted labs and EKG.  The pertinent results include:  Leukocytosis 11   Cardiac Monitoring:  The patient was maintained on a cardiac monitor.  I personally viewed and interpreted the cardiac monitored which showed an underlying rhythm of: Normal sinus rhythm   Medicines ordered and prescription drug management:  I ordered medication including: Solu-Medrol 125 mg 1 L LR bolus Benadryl 12.5 mg Pepcid 20 mg Toradol 15 mg Reevaluation of the patient after these medicines showed that the patient improved I have reviewed the patients home medicines and have made adjustments as needed    ED Course/Re-evaluation: Presents in no acute distress and is nontoxic-appearing.  Vitals without significant abnormality.  She is satting well on room air at 96% without increased respiratory effort.  Lungs CTA bilaterally.  She is speaking in full complete sentences.  On exam, she is swelling noted to the left side of her face, worse in the left eye.  She also has swelling and tenderness of the anterior neck just under the mandible.  Skin without rashes.  Symptoms not consistent with anaphylaxis, particularly since exposure occurred yesterday.  She was given Benadryl, steroids and Pepcid here in the emergency department and monitored for a few hours.  I then p.o. challenge patient and she tolerated p.o. intake well.  She  reports feeling much better.  Advised continue Benadryl and Pepcid over the next few days.  Patient was discharged home with prescription for EpiPen in case she has worsening reaction next time she is stung.  Advised follow-up with PCP.  Return precautions discussed. Discussed plan and management with my attending, Dr. Charm Barges who agrees with treatment and disposition.  Disposition:  After consideration of the diagnostic results, physical exam, history and the patients response to treatment feel that the patent would benefit from discharge.   Allergic reaction.: Plan and management as described above. Discharged home in good condition.   Final Clinical Impression(s) / ED Diagnoses Final diagnoses:  Allergic reaction, initial encounter    Rx / DC Orders ED Discharge Orders          Ordered    EPINEPHrine 0.3 mg/0.3 mL IJ SOAJ injection  As needed        08/04/21 1320              Janell Quiet, [place] 08/04/21 1630    Terrilee Files, MD 08/04/21 1734

## 2021-08-04 NOTE — ED Notes (Signed)
Pt given water, PO challenged. Pt tolerated well, Provider notified.

## 2021-08-04 NOTE — ED Notes (Signed)
Pt ambulated to bathroom, gait steady

## 2021-08-09 ENCOUNTER — Ambulatory Visit: Payer: Medicare Other

## 2021-08-09 DIAGNOSIS — R7982 Elevated C-reactive protein (CRP): Secondary | ICD-10-CM

## 2021-08-09 DIAGNOSIS — R0609 Other forms of dyspnea: Secondary | ICD-10-CM

## 2021-08-09 DIAGNOSIS — E782 Mixed hyperlipidemia: Secondary | ICD-10-CM

## 2021-08-15 DIAGNOSIS — R748 Abnormal levels of other serum enzymes: Secondary | ICD-10-CM | POA: Diagnosis not present

## 2021-08-15 DIAGNOSIS — M549 Dorsalgia, unspecified: Secondary | ICD-10-CM | POA: Diagnosis not present

## 2021-08-15 DIAGNOSIS — M1990 Unspecified osteoarthritis, unspecified site: Secondary | ICD-10-CM | POA: Diagnosis not present

## 2021-08-15 DIAGNOSIS — R5383 Other fatigue: Secondary | ICD-10-CM | POA: Diagnosis not present

## 2021-08-15 DIAGNOSIS — M6281 Muscle weakness (generalized): Secondary | ICD-10-CM | POA: Diagnosis not present

## 2021-08-23 NOTE — Progress Notes (Signed)
Tried calling patient no answer unable to leave vm mailbox is full

## 2021-08-28 NOTE — Progress Notes (Signed)
Called pt no answer and could not leave a vm 

## 2021-09-13 DIAGNOSIS — M722 Plantar fascial fibromatosis: Secondary | ICD-10-CM | POA: Diagnosis not present

## 2021-09-13 DIAGNOSIS — M21861 Other specified acquired deformities of right lower leg: Secondary | ICD-10-CM | POA: Diagnosis not present

## 2021-09-13 DIAGNOSIS — M792 Neuralgia and neuritis, unspecified: Secondary | ICD-10-CM | POA: Diagnosis not present

## 2021-09-13 DIAGNOSIS — M25371 Other instability, right ankle: Secondary | ICD-10-CM | POA: Diagnosis not present

## 2021-10-02 DIAGNOSIS — I10 Essential (primary) hypertension: Secondary | ICD-10-CM | POA: Diagnosis not present

## 2021-10-11 ENCOUNTER — Ambulatory Visit
Admission: RE | Admit: 2021-10-11 | Discharge: 2021-10-11 | Disposition: A | Discharge status: Home / Self Care | Payer: No Typology Code available for payment source | Source: Ambulatory Visit | Attending: Cardiology | Admitting: Cardiology

## 2021-10-11 DIAGNOSIS — E782 Mixed hyperlipidemia: Secondary | ICD-10-CM

## 2021-10-11 DIAGNOSIS — R7982 Elevated C-reactive protein (CRP): Secondary | ICD-10-CM

## 2021-10-11 DIAGNOSIS — R0609 Other forms of dyspnea: Secondary | ICD-10-CM

## 2021-10-17 ENCOUNTER — Ambulatory Visit: Payer: Medicare Other | Admitting: Cardiology

## 2021-10-20 ENCOUNTER — Encounter: Payer: Self-pay | Admitting: Cardiology

## 2021-10-20 ENCOUNTER — Ambulatory Visit: Payer: Medicare Other | Admitting: Cardiology

## 2021-10-20 VITALS — BP 150/82 | HR 72 | Temp 98.6°F | Resp 16 | Ht 66.0 in | Wt 238.0 lb

## 2021-10-20 DIAGNOSIS — R0609 Other forms of dyspnea: Secondary | ICD-10-CM | POA: Diagnosis not present

## 2021-10-20 DIAGNOSIS — I10 Essential (primary) hypertension: Secondary | ICD-10-CM | POA: Diagnosis not present

## 2021-10-20 DIAGNOSIS — E66812 Obesity, class 2: Secondary | ICD-10-CM

## 2021-10-20 DIAGNOSIS — I447 Left bundle-branch block, unspecified: Secondary | ICD-10-CM | POA: Diagnosis not present

## 2021-10-20 NOTE — Progress Notes (Signed)
Date:  10/20/2021   ID:  Amanda Bentley, DOB [birth year], MRN [id number]  PCP:  Carol Ada, MD  Cardiologist:  Rex Kras, DO, [facility]  (established care 07/27/2020) Former Cardiology Providers: Denton Brick, MD   Date: 10/20/21 Last Office Visit: 07/28/2020  Chief Complaint  Patient presents with   Follow-up    Palpitations, review echo and coronary calcium score   Shortness of Breath    HPI  Amanda Bentley is a 65 y.o. female whose past medical history and cardiovascular risk factors include: LBBB, hypertension, prediabetes, osteoarthritis, carpal tunnel syndrome, postmenopausal female, advanced age, obesity due to excess calories.  Referred to the practice for evaluation of a left bundle branch block and dyspnea.  With regards to left foot branch block patient was quite asymptomatic and therefore the shared decision was to proceed with an echocardiogram and a coronary calcium score given her shortness of breath predominantly with effort related activities.  It was postulated that the dyspnea on exertion was likely secondary to uncontrolled hypertension.  Last year she was started on hydrochlorothiazide but she informs me she is been taking it every other day because it causes her to urinate frequently.  And her sciatica also is not well controlled which may be contributing to her elevated blood pressures.  She denies anginal discomfort.  She continue to take care of her husband who has progressive Parkinson's disease and currently enrolled into hospice.  Overall functional capacity remains relatively stable.  Outside labs from July 2022 independently reviewed and noted below for further reference.  FUNCTIONAL STATUS: Enjoys walking and very active w/ yardwork and gardening. No structured exercise program or daily routine.   ALLERGIES: Allergies  Allergen Reactions   Benadryl [Diphenhydramine] Hives   Iohexol      Desc: pt had swelling from iv contrast yrs  ago. pt also does not like to take prednisone for it's side effects. she experiences facial tingling and the benadryl exacerbates this. did fine w/ all premeds and contrast., Onset Date: [id number]     MEDICATION LIST PRIOR TO VISIT: Current Meds  Medication Sig   ALPRAZolam (XANAX) 0.5 MG tablet Take 0.5 mg by mouth as needed.   EPINEPHrine 0.3 mg/0.3 mL IJ SOAJ injection Inject 0.3 mg into the muscle as needed for anaphylaxis.   losartan (COZAAR) 100 MG tablet Take 50 mg by mouth daily.   metoprolol tartrate (LOPRESSOR) 50 MG tablet Take 50 mg by mouth 2 (two) times daily.   VITAMIN D PO Take 1 tablet by mouth daily at 12 noon.     PAST MEDICAL HISTORY: Past Medical History:  Diagnosis Date   Complication of anesthesia    problems waking up-3/15   Endometriosis    GERD (gastroesophageal reflux disease)    Hypertension    LBBB (left bundle branch block)    PONV (postoperative nausea and vomiting)     PAST SURGICAL HISTORY: Past Surgical History:  Procedure Laterality Date   CARPAL TUNNEL RELEASE Right 03/31/2013   Procedure: RIGHT CARPAL TUNNEL RELEASE;  Surgeon: Cammie Sickle., MD;  Location: [facility];  Service: Orthopedics;  Laterality: Right;   CARPAL TUNNEL RELEASE Left 01/12/2014   Procedure: LEFT CARPAL TUNNEL RELEASE;  Surgeon: Daryll Brod, MD;  Location: [facility];  Service: Orthopedics;  Laterality: Left;   COLONOSCOPY     DILATION AND CURETTAGE OF UTERUS     ERCP     EYE SURGERY     lt cataract  12/15   EYE SURGERY     rt cataract 01/06/14   LAPAROSCOPY  2008,2000   explor lap   UPPER GI ENDOSCOPY      FAMILY HISTORY: The patient family history includes Breast cancer in her maternal grandmother; Lung disease in her mother; Stomach cancer in her sister. No family history of premature coronary disease or sudden cardiac death.  SOCIAL HISTORY:  The patient  reports that she has never smoked. She has never used smokeless  tobacco. She reports that she does not drink alcohol and does not use drugs.  REVIEW OF SYSTEMS: Review of Systems  Cardiovascular:  Negative for chest pain, claudication, dyspnea on exertion, irregular heartbeat, leg swelling, near-syncope, orthopnea, palpitations, paroxysmal nocturnal dyspnea and syncope.  Respiratory:  Negative for shortness of breath.   Hematologic/Lymphatic: Negative for bleeding problem.  Musculoskeletal:  Negative for muscle cramps and myalgias.  Neurological:  Negative for dizziness and light-headedness.    PHYSICAL EXAM:    10/20/2021    2:05 PM 10/20/2021    1:44 PM 08/04/2021    1:45 PM  Vitals with BMI  Height  5' 6"    Weight  238 lbs   BMI  48.54   Systolic 627 035 009  Diastolic 82 75 74  Pulse 72 67 68    CONSTITUTIONAL: Well-developed and well-nourished. No acute distress.  SKIN: Skin is warm and dry. No rash noted. No cyanosis. No pallor. No jaundice HEAD: Normocephalic and atraumatic.  EYES: No scleral icterus MOUTH/THROAT: Moist oral membranes.  NECK: No JVD present. No thyromegaly noted. No carotid bruits  LYMPHATIC: No visible cervical adenopathy.  CHEST Normal respiratory effort. No intercostal retractions  LUNGS: Clear to auscultation bilaterally.  No stridor. No wheezes. No rales.  CARDIOVASCULAR: Regular, positive S1-S2, no murmurs rubs or gallops appreciated. ABDOMINAL: Obese, soft, nontender, nondistended, positive bowel sounds in all 4 quadrants.  No apparent ascites.  EXTREMITIES: +1 bilateral peripheral edema, warm to touch HEMATOLOGIC: No significant bruising NEUROLOGIC: Oriented to person, place, and time. Nonfocal. Normal muscle tone.  PSYCHIATRIC: Normal mood and affect. Normal behavior. Cooperative  CARDIAC DATABASE: EKG: 07/28/2020: Normal sinus rhythm, 60 bpm, left axis deviation, left bundle branch block.    Echocardiogram: 08/09/2021:  Normal LV systolic function with visual EF 50-55%. Left ventricle cavity is normal  in size. Moderate concentric hypertrophy of the left ventricle.  Normal global wall motion. Doppler evidence of grade I (impaired) diastolic dysfunction, normal LAP.  Structurally normal mitral valve.  Mild (Grade I) mitral regurgitation.  Structurally normal tricuspid valve.  Mild tricuspid regurgitation.    Stress Testing: No results found for this or any previous visit from the past 1095 days.  Heart Catheterization: None  Coronary calcium score 10/11/2021: 1. No coronary atherosclerotic calcifications. The observed calcium score of 0 is at the zeroth percentile for subjects of the same age, gender, and race/ethnicity who are free of clinical cardiovascular disease and treated diabetes. 2.  Aortic Atherosclerosis (ICD10-I70.0).  LABORATORY DATA:    Latest Ref Rng & Units 08/04/2021   10:43 AM 01/25/2019   12:00 AM 01/12/2014    9:14 AM  CBC  WBC 4.0 - 10.5 K/uL 11.0  10.3    Hemoglobin 12.0 - 15.0 g/dL 15.6  15.1  14.6   Hematocrit 36.0 - 46.0 % 45.6  44.7  43.0   Platelets 150 - 400 K/uL 314  289         Latest Ref Rng & Units 08/04/2021   10:43 AM 01/25/2019  12:00 AM 01/12/2014    9:14 AM  CMP  Glucose 70 - 99 mg/dL 116  119  104   BUN 8 - 23 mg/dL 11  11  10    Creatinine 0.44 - 1.00 mg/dL 0.78  0.75  0.70   Sodium 135 - 145 mmol/L 140  139  142   Potassium 3.5 - 5.1 mmol/L 3.6  3.5  3.5   Chloride 98 - 111 mmol/L 108  104  104   CO2 22 - 32 mmol/L 24  23    Calcium 8.9 - 10.3 mg/dL 9.1  9.2      Lipid Panel  No results found for: "CHOL", "TRIG", "HDL", "CHOLHDL", "VLDL", "LDLCALC", "LDLDIRECT", "LABVLDL"  No components found for: "NTPROBNP" No results for input(s): "PROBNP" in the last 8760 hours. No results for input(s): "TSH" in the last 8760 hours.  BMP Recent Labs    08/04/21 1043  NA 140  K 3.6  CL 108  CO2 24  GLUCOSE 116*  BUN 11  CREATININE 0.78  CALCIUM 9.1  GFRNONAA >60    HEMOGLOBIN A1C No results found for: "HGBA1C",  "MPG"  External Labs: Collected: 06/29/2020 provided by PCP Creatinine 0.69 mg/dL. eGFR: 96 mL/min per 1.73 m Sodium 141, potassium 3.9, chloride 103, bicarb 27 AST 19, ALT 21, alkaline phosphatase 61 Hemoglobin 15 g/dL, hematocrit 45% Total cholesterol 170, triglycerides 170, HDL 39, LDL 101, non-HDL 131 TSH 3.02 Hemoglobin A1c 5.9  External Labs: Collected: 07/20/2021 provided by primary physician Hemoglobin 14.9 g/dL, hematocrit 44.6%.   Platelets 286 BUN 14, creatinine 0.79. Sodium 141, potassium 4, chloride 104, bicarb 28. AST 28, ALT 26, alkaline phosphatase 73 CPK 282 (normal levels 35-165) C-reactive protein, cardiac 5.86 A1c 5.9. Total cholesterol 178, triglycerides 124, HDL 45, LDL 111, non-HDL 133 ESR 20 (upper limits of normal) TSH 2.4  IMPRESSION:    ICD-10-CM   1. Dyspnea on exertion  R06.09     2. Benign hypertension  I10     3. LBBB (left bundle branch block)  I44.7     4. Class 2 severe obesity due to excess calories with serious comorbidity and body mass index (BMI) of 38.0 to 38.9 in adult [facility])  E66.01    Z68.38         RECOMMENDATIONS: Amanda Bentley is a 65 y.o. female whose past medical history and cardiac risk factors include: LBBB, hypertension, prediabetes, osteoarthritis, carpal tunnel syndrome, postmenopausal female, advanced age, obesity due to excess calories.  Patient was referred to the practice for evaluation of left bundle branch block.  Clinically she did not have anginal discomfort but did have dyspnea predominantly with effort related activities.  It was thought that the dyspnea on exertion was likely due to uncontrolled hypertension and her medications were uptitrated.  Patient was requested to take hydrochlorothiazide in the morning; however, due to increased diuresis she has been taking it every other day.  Office blood pressures are still not well controlled.  Recommended that she take the medications as prescribed and to  monitor her blood pressures remotely and if still elevated to call the office or her PCP for further guidance.  To further risk stratify her given her elevated 10-year risk of ASCVD that shared decision was to proceed with a coronary calcium score.  Total CAC is 0 and her echocardiogram notes preserved LVEF without any significant valvular heart disease.  Outside labs from August 06, 2021 independently reviewed.  LDL levels are within acceptable limits but would  encourage her to improve her modifiable cardiovascular risk factors and if they continue to trend of pharmacological therapy may be considered.  We also discussed considering stress test to further evaluate her dyspnea; however, patient states that since her symptoms are overall stable and have not been progressive she would like to hold off on it for now.   FINAL MEDICATION LIST END OF ENCOUNTER: No orders of the defined types were placed in this encounter.    There are no discontinued medications.    Current Outpatient Medications:    ALPRAZolam (XANAX) 0.5 MG tablet, Take 0.5 mg by mouth as needed., Disp: , Rfl:    EPINEPHrine 0.3 mg/0.3 mL IJ SOAJ injection, Inject 0.3 mg into the muscle as needed for anaphylaxis., Disp: 1 each, Rfl: 0   losartan (COZAAR) 100 MG tablet, Take 50 mg by mouth daily., Disp: , Rfl:    metoprolol tartrate (LOPRESSOR) 50 MG tablet, Take 50 mg by mouth 2 (two) times daily., Disp: , Rfl:    VITAMIN D PO, Take 1 tablet by mouth daily at 12 noon., Disp: , Rfl:    hydrochlorothiazide (HYDRODIURIL) 25 MG tablet, Take 1 tablet (25 mg total) by mouth every morning., Disp: 90 tablet, Rfl: 0  No orders of the defined types were placed in this encounter.   There are no Patient Instructions on file for this visit.   --Continue cardiac medications as reconciled in final medication list. --Return in about 1 year (around 10/21/2022) for Follow up, Dyspnea. Or sooner if needed. --Continue follow-up with your primary  care physician regarding the management of your other chronic comorbid conditions.  Patient's questions and concerns were addressed to her satisfaction. She voices understanding of the instructions provided during this encounter.   This note was created using a voice recognition software as a result there may be grammatical errors inadvertently enclosed that do not reflect the nature of this encounter. Every attempt is made to correct such errors.  Rex Kras, [place], [facility]  Pager: [phone] Office: [phone]

## 2021-10-25 DIAGNOSIS — M5441 Lumbago with sciatica, right side: Secondary | ICD-10-CM | POA: Diagnosis not present

## 2021-10-25 DIAGNOSIS — G8929 Other chronic pain: Secondary | ICD-10-CM | POA: Diagnosis not present

## 2021-10-27 ENCOUNTER — Ambulatory Visit: Payer: Medicare Other | Admitting: Neurology

## 2022-01-02 DIAGNOSIS — I10 Essential (primary) hypertension: Secondary | ICD-10-CM | POA: Diagnosis not present

## 2022-01-02 DIAGNOSIS — F419 Anxiety disorder, unspecified: Secondary | ICD-10-CM | POA: Diagnosis not present

## 2022-01-19 ENCOUNTER — Ambulatory Visit: Payer: Medicare Other | Admitting: Neurology

## 2022-02-07 DIAGNOSIS — R5383 Other fatigue: Secondary | ICD-10-CM | POA: Diagnosis not present

## 2022-02-07 DIAGNOSIS — M6281 Muscle weakness (generalized): Secondary | ICD-10-CM | POA: Diagnosis not present

## 2022-02-07 DIAGNOSIS — M1990 Unspecified osteoarthritis, unspecified site: Secondary | ICD-10-CM | POA: Diagnosis not present

## 2022-02-07 DIAGNOSIS — M549 Dorsalgia, unspecified: Secondary | ICD-10-CM | POA: Diagnosis not present

## 2022-02-07 DIAGNOSIS — R748 Abnormal levels of other serum enzymes: Secondary | ICD-10-CM | POA: Diagnosis not present

## 2022-02-13 DIAGNOSIS — R7303 Prediabetes: Secondary | ICD-10-CM | POA: Diagnosis not present

## 2022-02-13 DIAGNOSIS — I10 Essential (primary) hypertension: Secondary | ICD-10-CM | POA: Diagnosis not present

## 2022-02-13 DIAGNOSIS — F439 Reaction to severe stress, unspecified: Secondary | ICD-10-CM | POA: Diagnosis not present

## 2022-02-13 DIAGNOSIS — F419 Anxiety disorder, unspecified: Secondary | ICD-10-CM | POA: Diagnosis not present

## 2022-02-13 DIAGNOSIS — E559 Vitamin D deficiency, unspecified: Secondary | ICD-10-CM | POA: Diagnosis not present

## 2022-02-19 ENCOUNTER — Other Ambulatory Visit: Payer: Self-pay

## 2022-02-19 DIAGNOSIS — R202 Paresthesia of skin: Secondary | ICD-10-CM

## 2022-02-23 ENCOUNTER — Ambulatory Visit: Payer: Medicare Other | Admitting: Neurology

## 2022-02-23 DIAGNOSIS — R202 Paresthesia of skin: Secondary | ICD-10-CM | POA: Diagnosis not present

## 2022-02-23 DIAGNOSIS — R531 Weakness: Secondary | ICD-10-CM

## 2022-02-23 NOTE — Procedures (Signed)
[facility]  [place], [address]  [place], [place] [address] Tel: [phone] Fax: [phone] Test Date:  02/23/2022  Patient: Amanda Bentley DOB: 1955-05-11 Physician: Narda Amber, DO  Sex: Female Height: 5' 6"$  Ref Phys: Lahoma Rocker, MD  ID#: [id number]   Technician:    History: This is a 67 year old female referred for evaluation of hyperCKemia and weakness.   NCV & EMG Findings: Extensive electrodiagnostic testing of the right upper and lower extremity shows:  Right median, ulnar, sural, and superficial peroneal sensory responses are within normal limits. Right median, ulnar, peroneal, and tibial motor responses are within normal limits. There is no evidence of active or chronic motor axonal loss changes affecting any of the tested muscles.  Motor unit configuration and recruitment pattern is within normal limits.  Impression: This is a normal study of the right upper and lower extremities.  In particular, there is no evidence of a diffuse myopathy, neuropathy, or cervical/lumbosacral radiculopathy.   ___________________________ Narda Amber, DO    Nerve Conduction Studies   Stim Site NR Peak (ms) Norm Peak (ms) O-P Amp (V) Norm O-P Amp  Right Median Anti Sensory (2nd Digit)  33 C  Wrist    3.4 <3.8 12.8 >10  Right Sup Peroneal Anti Sensory (Ant Lat Mall)  33 C  12 cm    3.2 <4.6 7.4 >3  Right Sural Anti Sensory (Lat Mall)  33 C  Calf    2.5 <4.6 5.1 >3  Right Ulnar Anti Sensory (5th Digit)  33 C  Wrist    2.7 <3.2 24.6 >5     Stim Site NR Onset (ms) Norm Onset (ms) O-P Amp (mV) Norm O-P Amp Site1 Site2 Delta-0 (ms) Dist (cm) Vel (m/s) Norm Vel (m/s)  Right Median Motor (Abd Poll Brev)  33 C  Wrist    3.5 <4.0 9.3 >5 Elbow Wrist 4.9 30.0 61 >50  Elbow    8.4  9.1         Right Peroneal Motor (Ext Dig Brev)  33 C  Ankle    3.8 <6.0 3.6 >2.5 B Fib Ankle 8.5 39.0 46 >40  B Fib    12.3  3.2  Poplt B Fib 1.7 8.0 47 >40  Poplt    14.0   2.9         Right Tibial Motor (Abd Hall Brev)  33 C  Ankle    3.1 <6.0 9.8 >4 Knee Ankle 9.7 42.0 43 >40  Knee    12.8  6.5         Right Ulnar Motor (Abd Dig Minimi)  33 C  Wrist    2.4 <3.1 11.6 >7 B Elbow Wrist 3.7 21.0 57 >50  B Elbow    6.1  10.9  A Elbow B Elbow 1.6 10.0 62 >50  A Elbow    7.7  9.6          Electromyography   Side Muscle Ins.Act Fibs Fasc Recrt Amp Dur Poly Activation Comment  Right 1stDorInt Nml Nml Nml Nml Nml Nml Nml Nml N/A  Right PronatorTeres Nml Nml Nml Nml Nml Nml Nml Nml N/A  Right Biceps Nml Nml Nml Nml Nml Nml Nml Nml N/A  Right Triceps Nml Nml Nml Nml Nml Nml Nml Nml N/A  Right Deltoid Nml Nml Nml Nml Nml Nml Nml Nml N/A  Right AntTibialis Nml Nml Nml Nml Nml Nml Nml Nml N/A  Right Gastroc Nml Nml Nml Nml Nml Nml Nml  Nml N/A  Right RectFemoris Nml Nml Nml Nml Nml Nml Nml Nml N/A  Right BicepsFemS Nml Nml Nml Nml Nml Nml Nml Nml N/A      Waveforms:

## 2022-03-20 ENCOUNTER — Ambulatory Visit: Payer: Medicare Other | Admitting: Neurology

## 2022-03-28 DIAGNOSIS — R5383 Other fatigue: Secondary | ICD-10-CM | POA: Diagnosis not present

## 2022-03-31 DIAGNOSIS — R140 Abdominal distension (gaseous): Secondary | ICD-10-CM | POA: Diagnosis not present

## 2022-03-31 DIAGNOSIS — K5909 Other constipation: Secondary | ICD-10-CM | POA: Diagnosis not present

## 2022-03-31 DIAGNOSIS — K219 Gastro-esophageal reflux disease without esophagitis: Secondary | ICD-10-CM | POA: Diagnosis not present

## 2022-04-04 DIAGNOSIS — R399 Unspecified symptoms and signs involving the genitourinary system: Secondary | ICD-10-CM | POA: Diagnosis not present

## 2022-04-16 ENCOUNTER — Ambulatory Visit: Payer: Medicare Other | Admitting: Neurology

## 2022-04-16 ENCOUNTER — Encounter: Payer: Self-pay | Admitting: Neurology

## 2022-04-16 VITALS — BP 148/101 | HR 74 | Ht 66.0 in | Wt 236.0 lb

## 2022-04-16 DIAGNOSIS — R292 Abnormal reflex: Secondary | ICD-10-CM | POA: Diagnosis not present

## 2022-04-16 DIAGNOSIS — R29898 Other symptoms and signs involving the musculoskeletal system: Secondary | ICD-10-CM

## 2022-04-16 NOTE — Progress Notes (Signed)
[facility] Clinic Note - Initial Visit   Date: 04/16/2022   Amanda Bentley MRN: [id number] DOB: [birth year]   Dear Dr. Tamala Julian:  Thank you for your kind referral of Amanda Bentley for consultation of weakness. Although her history is well known to you, please allow Amanda Bentley to reiterate it for the purpose of our medical record. The patient was accompanied to the clinic by self.    PAIZLIE KAUFFMANN is a 67 y.o. right-handed female with GERD and hypertension presenting for evaluation of generalized weakness.   IMPRESSION/PLAN: Generalized fatigue without objective weakness on exam.  NCS/EMG is normal without neuropathy or myopathy.  Her exam shows brisk reflexes at the patella which may indicated lumbar canal stenosis, which could contribute to her weakness.  To evaluate for this, she will have MRI lumbar spine wo contrast.  I explained that lumbar spondylosis, however, would not cause upper extremity weakness/fatigue.   She has a number of other contributor to fatigue - vitamin B12 deficiency, husband in hospice and it's possible she may have caregiver burnout. She reports symptoms starting post-COVID, so long haul COVID is also considered. There are no signs of central neurological disorder or myasthenia gravis.   Further recommendations pending results.   ------------------------------------------------------------- History of present illness: She had COVID in August 2022 and reports having generalized fatigue since this time.  Her husband has Parkinson's disease and is in hospice.  She endorses feeling tired all the time, even after getting adequate rest within an hour of being awake, she wants to sleep again.  She has low energy and low mood.  She feels weakness throughout, especially in the legs. "Legs feel like they can collapse".  She walks unassisted and has not had any falls, but feels unsteady. NCS/EMG of the right arm and leg from February 2024 was  normal.  She has low back pain for many years.  No numbness/tingling or shooting pain.    Out-side paper records, electronic medical record, and images have been reviewed where available and summarized as:  NCS/EMG right arm and leg 02/23/2022: This is a normal study of the right upper and lower extremities. In particular, there is no evidence of a diffuse myopathy, neuropathy, or cervical/lumbosacral radiculopathy.     Past Medical History:  Diagnosis Date   Complication of anesthesia    problems waking up-3/15   Endometriosis    GERD (gastroesophageal reflux disease)    Hypertension    LBBB (left bundle branch block)    PONV (postoperative nausea and vomiting)     Past Surgical History:  Procedure Laterality Date   CARPAL TUNNEL RELEASE Right 03/31/2013   Procedure: RIGHT CARPAL TUNNEL RELEASE;  Surgeon: Cammie Sickle., MD;  Location: [facility];  Service: Orthopedics;  Laterality: Right;   CARPAL TUNNEL RELEASE Left 01/12/2014   Procedure: LEFT CARPAL TUNNEL RELEASE;  Surgeon: Daryll Brod, MD;  Location: [facility];  Service: Orthopedics;  Laterality: Left;   COLONOSCOPY     DILATION AND CURETTAGE OF UTERUS     ERCP     EYE SURGERY     lt cataract 12/15   EYE SURGERY     rt cataract 01/06/14   LAPAROSCOPY  [id number]   explor lap   UPPER GI ENDOSCOPY       Medications:  Outpatient Encounter Medications as of 04/16/2022  Medication Sig   ALPRAZolam (XANAX) 0.5 MG tablet Take 0.5 mg by mouth as needed.   EPINEPHrine  0.3 mg/0.3 mL IJ SOAJ injection Inject 0.3 mg into the muscle as needed for anaphylaxis.   hydrochlorothiazide (HYDRODIURIL) 25 MG tablet Take 1 tablet (25 mg total) by mouth every morning.   losartan (COZAAR) 100 MG tablet Take 100 mg by mouth daily.   metoprolol tartrate (LOPRESSOR) 50 MG tablet Take 50 mg by mouth 2 (two) times daily.   VITAMIN D PO Take 1 tablet by mouth daily at 12 noon.   No facility-administered encounter  medications on file as of 04/16/2022.    Allergies:  Allergies  Allergen Reactions   Benadryl [Diphenhydramine] Hives   Iohexol      Desc: pt had swelling from iv contrast yrs ago. pt also does not like to take prednisone for it's side effects. she experiences facial tingling and the benadryl exacerbates this. did fine w/ all premeds and contrast., Onset Date: [id number]     Family History: Family History  Problem Relation Age of Onset   Lung disease Mother    Stomach cancer Sister    Breast cancer Maternal Grandmother     Social History: Social History   Tobacco Use   Smoking status: Never   Smokeless tobacco: Never  Vaping Use   Vaping Use: Never used  Substance Use Topics   Alcohol use: No   Drug use: No   Social History   Social History Narrative   Right Handed   Lives in a one story home with her husband          Husband in hospice    Vital Signs:  BP (!) 148/101   Pulse 74   Ht 5\' 6"  (1.676 m)   Wt 236 lb (107 kg)   SpO2 96%   BMI 38.09 kg/m    Neurological Exam: MENTAL STATUS including orientation to time, place, person, recent and remote memory, attention span and concentration, language, and fund of knowledge is normal.  Speech is not dysarthric.  CRANIAL NERVES: II:  No visual field defects.     III-IV-VI: Pupils equal round and reactive to light.  Normal conjugate, extra-ocular eye movements in all directions of gaze.  No nystagmus.  No ptosis.   V:  Normal facial sensation.    VII:  Normal facial symmetry and movements.   VIII:  Normal hearing and vestibular function.   IX-X:  Normal palatal movement.   XI:  Normal shoulder shrug and head rotation.   XII:  Normal tongue strength and range of motion, no deviation or fasciculation.  MOTOR:  No atrophy, fasciculations or abnormal movements.  No pronator drift.   Upper Extremity:  Right  Left  Deltoid  5/5   5/5   Biceps  5/5   5/5   Triceps  5/5   5/5   Wrist extensors  5/5   5/5   Wrist  flexors  5/5   5/5   Finger extensors  5/5   5/5   Finger flexors  5/5   5/5   Dorsal interossei  5/5   5/5   Abductor pollicis  5/5   5/5   Tone (Ashworth scale)  0  0   Lower Extremity:  Right  Left  Hip flexors  5/5   5/5   Knee flexors  5/5   5/5   Knee extensors  5/5   5/5   Dorsiflexors  5/5   5/5   Plantarflexors  5/5   5/5   Toe extensors  5/5   5/5   Toe flexors  5/5   5/5   Tone (Ashworth scale)  0  0   MSRs:                                           Right        Left brachioradialis 2+  2+  biceps 2+  2+  triceps 2+  2+  patellar 3+  3+  ankle jerk 2+  2+  Hoffman no  no  plantar response down  down   SENSORY:  Normal and symmetric perception of light touch, pinprick, vibration, and proprioception.  Romberg's sign absent.   COORDINATION/GAIT: Normal finger-to- nose-finger.  Intact rapid alternating movements bilaterally.  Able to rise from a chair without using arms.  Gait narrow based and stable. Stressed gait intact. Unsteadiness with tandem gait.     Thank you for allowing me to participate in patient's care.  If I can answer any additional questions, I would be pleased to do so.    Sincerely,    Medea Deines K. Posey Pronto, DO

## 2022-04-16 NOTE — Patient Instructions (Signed)
MRI lumbar spine without contrast    

## 2022-04-23 DIAGNOSIS — R319 Hematuria, unspecified: Secondary | ICD-10-CM | POA: Diagnosis not present

## 2022-04-23 DIAGNOSIS — R350 Frequency of micturition: Secondary | ICD-10-CM | POA: Diagnosis not present

## 2022-04-23 DIAGNOSIS — N819 Female genital prolapse, unspecified: Secondary | ICD-10-CM | POA: Diagnosis not present

## 2022-05-04 ENCOUNTER — Encounter: Payer: Self-pay | Admitting: Neurology

## 2022-05-13 ENCOUNTER — Other Ambulatory Visit: Payer: Medicare Other

## 2022-05-17 DIAGNOSIS — L237 Allergic contact dermatitis due to plants, except food: Secondary | ICD-10-CM | POA: Diagnosis not present

## 2022-05-22 ENCOUNTER — Other Ambulatory Visit: Payer: Self-pay | Admitting: Family Medicine

## 2022-05-22 DIAGNOSIS — Z1231 Encounter for screening mammogram for malignant neoplasm of breast: Secondary | ICD-10-CM

## 2022-05-25 DIAGNOSIS — R61 Generalized hyperhidrosis: Secondary | ICD-10-CM | POA: Diagnosis not present

## 2022-05-25 DIAGNOSIS — L659 Nonscarring hair loss, unspecified: Secondary | ICD-10-CM | POA: Diagnosis not present

## 2022-05-25 DIAGNOSIS — R102 Pelvic and perineal pain: Secondary | ICD-10-CM | POA: Diagnosis not present

## 2022-05-26 ENCOUNTER — Other Ambulatory Visit: Payer: Medicare Other

## 2022-05-30 ENCOUNTER — Ambulatory Visit
Admission: RE | Admit: 2022-05-30 | Discharge: 2022-05-30 | Disposition: A | Discharge status: Home / Self Care | Payer: Medicare Other | Source: Ambulatory Visit | Attending: Family Medicine | Admitting: Family Medicine

## 2022-05-30 DIAGNOSIS — Z1231 Encounter for screening mammogram for malignant neoplasm of breast: Secondary | ICD-10-CM

## 2022-05-31 ENCOUNTER — Ambulatory Visit
Admission: RE | Admit: 2022-05-31 | Discharge: 2022-05-31 | Disposition: A | Discharge status: Home / Self Care | Payer: Medicare Other | Source: Ambulatory Visit | Attending: Neurology | Admitting: Neurology

## 2022-05-31 DIAGNOSIS — R531 Weakness: Secondary | ICD-10-CM | POA: Diagnosis not present

## 2022-05-31 DIAGNOSIS — R292 Abnormal reflex: Secondary | ICD-10-CM

## 2022-05-31 DIAGNOSIS — R29898 Other symptoms and signs involving the musculoskeletal system: Secondary | ICD-10-CM

## 2022-05-31 DIAGNOSIS — M6281 Muscle weakness (generalized): Secondary | ICD-10-CM | POA: Diagnosis not present

## 2022-06-07 DIAGNOSIS — R778 Other specified abnormalities of plasma proteins: Secondary | ICD-10-CM | POA: Diagnosis not present

## 2022-06-07 DIAGNOSIS — R102 Pelvic and perineal pain: Secondary | ICD-10-CM | POA: Diagnosis not present

## 2022-06-07 DIAGNOSIS — R61 Generalized hyperhidrosis: Secondary | ICD-10-CM | POA: Diagnosis not present

## 2022-06-19 DIAGNOSIS — R5383 Other fatigue: Secondary | ICD-10-CM | POA: Diagnosis not present

## 2022-06-19 DIAGNOSIS — I10 Essential (primary) hypertension: Secondary | ICD-10-CM | POA: Diagnosis not present

## 2022-06-19 DIAGNOSIS — Z0000 Encounter for general adult medical examination without abnormal findings: Secondary | ICD-10-CM | POA: Diagnosis not present

## 2022-06-19 DIAGNOSIS — F4321 Adjustment disorder with depressed mood: Secondary | ICD-10-CM | POA: Diagnosis not present

## 2022-07-10 DIAGNOSIS — R5383 Other fatigue: Secondary | ICD-10-CM | POA: Diagnosis not present

## 2022-07-25 DIAGNOSIS — K219 Gastro-esophageal reflux disease without esophagitis: Secondary | ICD-10-CM | POA: Diagnosis not present

## 2022-07-31 DIAGNOSIS — D510 Vitamin B12 deficiency anemia due to intrinsic factor deficiency: Secondary | ICD-10-CM | POA: Diagnosis not present

## 2022-07-31 DIAGNOSIS — E7800 Pure hypercholesterolemia, unspecified: Secondary | ICD-10-CM | POA: Diagnosis not present

## 2022-07-31 DIAGNOSIS — I10 Essential (primary) hypertension: Secondary | ICD-10-CM | POA: Diagnosis not present

## 2022-07-31 DIAGNOSIS — R7303 Prediabetes: Secondary | ICD-10-CM | POA: Diagnosis not present

## 2022-07-31 DIAGNOSIS — E559 Vitamin D deficiency, unspecified: Secondary | ICD-10-CM | POA: Diagnosis not present

## 2022-08-07 ENCOUNTER — Telehealth: Payer: Self-pay

## 2022-08-07 ENCOUNTER — Encounter: Payer: Self-pay | Admitting: Neurology

## 2022-08-07 ENCOUNTER — Ambulatory Visit: Payer: Medicare Other | Admitting: Neurology

## 2022-08-07 VITALS — BP 161/82 | HR 73 | Ht 66.0 in | Wt 241.0 lb

## 2022-08-07 DIAGNOSIS — R7309 Other abnormal glucose: Secondary | ICD-10-CM | POA: Diagnosis not present

## 2022-08-07 DIAGNOSIS — Z01419 Encounter for gynecological examination (general) (routine) without abnormal findings: Secondary | ICD-10-CM | POA: Diagnosis not present

## 2022-08-07 DIAGNOSIS — M47816 Spondylosis without myelopathy or radiculopathy, lumbar region: Secondary | ICD-10-CM | POA: Diagnosis not present

## 2022-08-07 DIAGNOSIS — R29898 Other symptoms and signs involving the musculoskeletal system: Secondary | ICD-10-CM

## 2022-08-07 DIAGNOSIS — R5383 Other fatigue: Secondary | ICD-10-CM | POA: Diagnosis not present

## 2022-08-07 DIAGNOSIS — R292 Abnormal reflex: Secondary | ICD-10-CM

## 2022-08-07 DIAGNOSIS — Z6834 Body mass index (BMI) 34.0-34.9, adult: Secondary | ICD-10-CM | POA: Diagnosis not present

## 2022-08-07 NOTE — Patient Instructions (Signed)
We will refer you to [facility] 

## 2022-08-07 NOTE — Progress Notes (Signed)
Follow-up Visit   Date: 08/07/2022    Amanda Bentley MRN: [id number] DOB: 18-Jan-1955    Amanda Bentley is a 67 y.o. right-handed Caucasian female with GERD and hypertension returning to the clinic for follow-up of bilateral leg weakness.  The patient was accompanied to the clinic by self.   IMPRESSION/PLAN: Lumbar radiculopathy affecting the right > left leg.  MRI lumbar spine shows osteophyte complex at the right L3-4 and L4-5 with lateral recess stenosis.  There is no severe central canal stenosis to explain her exertional leg pain.  I would like to check higher in the spinal cord at the cervical level to see if there is spinal canal stenosis.  For her low back pain, I will refer to [facility].  --------------------------------------------- History of present illness: She had COVID in August 2022 and reports having generalized fatigue since this time.  Her husband has Parkinson's disease and is in hospice.  She endorses feeling tired all the time, even after getting adequate rest within an hour of being awake, she wants to sleep again.  She has low energy and low mood.  She feels weakness throughout, especially in the legs. "Legs feel like they can collapse".  She walks unassisted and has not had any falls, but feels unsteady. NCS/EMG of the right arm and leg from February 2024 was normal.  She has low back pain for many years.  No numbness/tingling or shooting pain.    UPDATE 7/223/2024: She is here for follow-up visit.  Her husband passed away a few months ago.  She was unable to do PT because of cost.  She continues to have weakness in the legs which is worse with exertion, especially in the right leg.  She endorses low back pain.  She is normally very active and has been unable to keep up with her normal activities, such as keeping up with her yard and home. MRI lumbar spine from May 2024 shows osteophyte complex at the right L3-4 and L4-5 with lateral recess  stenosis, no severe canal stenosis.   Medications:  Current Outpatient Medications on File Prior to Visit  Medication Sig Dispense Refill   ALPRAZolam (XANAX) 0.5 MG tablet Take 0.5 mg by mouth as needed.     EPINEPHrine 0.3 mg/0.3 mL IJ SOAJ injection Inject 0.3 mg into the muscle as needed for anaphylaxis. 1 each 0   hydrochlorothiazide (HYDRODIURIL) 25 MG tablet Take 1 tablet (25 mg total) by mouth every morning. 90 tablet 0   losartan (COZAAR) 100 MG tablet Take 100 mg by mouth daily.     metoprolol tartrate (LOPRESSOR) 50 MG tablet Take 50 mg by mouth 2 (two) times daily.     VITAMIN D PO Take 1 tablet by mouth daily at 12 noon.     No current facility-administered medications on file prior to visit.    Allergies:  Allergies  Allergen Reactions   Benadryl [Diphenhydramine] Hives   Iohexol      Desc: pt had swelling from iv contrast yrs ago. pt also does not like to take prednisone for it's side effects. she experiences facial tingling and the benadryl exacerbates this. did fine w/ all premeds and contrast., Onset Date: [id number]     Vital Signs:  BP (!) 161/82   Pulse 73   Ht 5\' 6"  (1.676 m)   Wt 241 lb (109.3 kg)   SpO2 95%   BMI 38.90 kg/m    Neurological Exam: MENTAL STATUS including orientation to time,  place, person, recent and remote memory, attention span and concentration, language, and fund of knowledge is normal.  Speech is not dysarthric.  CRANIAL NERVES:   Pupils equal round and reactive to light.  Normal conjugate, extra-ocular eye movements in all directions of gaze.  No ptosis.  Face is symmetric. Palate elevates symmetrically.  Tongue is midline.  MOTOR:  Motor strength is 5/5 in all extremities.  No atrophy, fasciculations or abnormal movements.  No pronator drift.  Tone is normal.    MSRs:  Reflexes are 2+/4 throughout except 3+/4 bilateral patella.  SENSORY:  Intact to vibration throughout.  COORDINATION/GAIT:  Normal finger-to- nose-finger.  Intact  rapid alternating movements bilaterally.  Gait narrow based and stable.   Data: MRI lumbar spine wo contrast 06/07/2022: 1. Multilevel lumbar spondylosis, progressed since 2008 and most significant at L4-5 where there is displacement of the traversing right L5 nerve root in the lateral recess and moderate right neural foraminal narrowing, worse from prior. 2. At L3-4, right eccentric disc-osteophyte complex and facet arthropathy results in displacement of the traversing right L4 nerve root in the lateral recess. 3. L5-S1 moderate bilateral facet arthropathy with periarticular edema, which can be a cause of back pain.    Thank you for allowing me to participate in patient's care.  If I can answer any additional questions, I would be pleased to do so.    Sincerely,    Trine Fread K. Allena Katz, DO

## 2022-08-07 NOTE — Telephone Encounter (Signed)
Called patient and left a message for a call back.   Need to inform patient that Dr. Allena Katz would like to order a MRI cervical spine to ensure her cervical spin is not contributing to her weakness. Plan to see [facility] is still in place. Just want to add MRI cervical imaging. I have placed order and need to inform patient.

## 2022-08-08 DIAGNOSIS — E161 Other hypoglycemia: Secondary | ICD-10-CM | POA: Diagnosis not present

## 2022-08-08 DIAGNOSIS — R5383 Other fatigue: Secondary | ICD-10-CM | POA: Diagnosis not present

## 2022-08-08 DIAGNOSIS — R748 Abnormal levels of other serum enzymes: Secondary | ICD-10-CM | POA: Diagnosis not present

## 2022-08-08 DIAGNOSIS — M549 Dorsalgia, unspecified: Secondary | ICD-10-CM | POA: Diagnosis not present

## 2022-08-08 DIAGNOSIS — M1990 Unspecified osteoarthritis, unspecified site: Secondary | ICD-10-CM | POA: Diagnosis not present

## 2022-08-08 DIAGNOSIS — I10 Essential (primary) hypertension: Secondary | ICD-10-CM | POA: Diagnosis not present

## 2022-08-08 NOTE — Telephone Encounter (Signed)
The patient is returning call to [facility]

## 2022-08-08 NOTE — Telephone Encounter (Signed)
Called patient and informed her of below and adding in the Cervical Spine MRI. Patient is agreeable to that and aware [facility] will contact her for scheduling.

## 2022-08-09 DIAGNOSIS — R5383 Other fatigue: Secondary | ICD-10-CM | POA: Diagnosis not present

## 2022-08-16 DIAGNOSIS — R7301 Impaired fasting glucose: Secondary | ICD-10-CM | POA: Diagnosis not present

## 2022-08-17 ENCOUNTER — Ambulatory Visit: Payer: Medicare Other | Admitting: Cardiology

## 2022-08-21 ENCOUNTER — Encounter: Payer: Self-pay | Admitting: Cardiology

## 2022-08-21 ENCOUNTER — Ambulatory Visit: Payer: Medicare Other | Admitting: Cardiology

## 2022-08-21 VITALS — BP 160/82 | HR 82 | Resp 16 | Ht 66.0 in | Wt 241.0 lb

## 2022-08-21 DIAGNOSIS — R0609 Other forms of dyspnea: Secondary | ICD-10-CM | POA: Diagnosis not present

## 2022-08-21 DIAGNOSIS — I447 Left bundle-branch block, unspecified: Secondary | ICD-10-CM | POA: Diagnosis not present

## 2022-08-21 DIAGNOSIS — I10 Essential (primary) hypertension: Secondary | ICD-10-CM

## 2022-08-21 DIAGNOSIS — R002 Palpitations: Secondary | ICD-10-CM
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
## 2022-08-30 DIAGNOSIS — Z6835 Body mass index (BMI) 35.0-35.9, adult: Secondary | ICD-10-CM | POA: Diagnosis not present

## 2022-08-30 DIAGNOSIS — U071 COVID-19: Secondary | ICD-10-CM | POA: Diagnosis not present

## 2022-08-30 DIAGNOSIS — R030 Elevated blood-pressure reading, without diagnosis of hypertension: Secondary | ICD-10-CM | POA: Diagnosis not present

## 2022-08-30 DIAGNOSIS — R059 Cough, unspecified: Secondary | ICD-10-CM | POA: Diagnosis not present

## 2022-08-30 DIAGNOSIS — E6609 Other obesity due to excess calories: Secondary | ICD-10-CM | POA: Diagnosis not present

## 2022-08-30 DIAGNOSIS — R6883 Chills (without fever): Secondary | ICD-10-CM | POA: Diagnosis not present

## 2022-08-30 DIAGNOSIS — Z20822 Contact with and (suspected) exposure to covid-19: Secondary | ICD-10-CM | POA: Diagnosis not present

## 2022-08-31 ENCOUNTER — Encounter: Payer: Self-pay | Admitting: Neurology

## 2022-09-03 ENCOUNTER — Ambulatory Visit: Payer: Medicare Other | Admitting: Neurology

## 2022-09-04 ENCOUNTER — Other Ambulatory Visit: Payer: Medicare Other

## 2022-09-07 ENCOUNTER — Other Ambulatory Visit: Payer: Medicare Other

## 2022-09-07 DIAGNOSIS — R002 Palpitations: Secondary | ICD-10-CM

## 2022-09-27 ENCOUNTER — Ambulatory Visit: Payer: Medicare Other

## 2022-09-27 DIAGNOSIS — I10 Essential (primary) hypertension: Secondary | ICD-10-CM

## 2022-09-27 DIAGNOSIS — R0609 Other forms of dyspnea: Secondary | ICD-10-CM | POA: Diagnosis not present

## 2022-09-27 DIAGNOSIS — R002 Palpitations: Secondary | ICD-10-CM | POA: Diagnosis not present

## 2022-09-30 ENCOUNTER — Other Ambulatory Visit: Payer: Medicare Other

## 2022-10-04 DIAGNOSIS — E538 Deficiency of other specified B group vitamins: Secondary | ICD-10-CM | POA: Diagnosis not present

## 2022-10-04 DIAGNOSIS — K219 Gastro-esophageal reflux disease without esophagitis: Secondary | ICD-10-CM | POA: Diagnosis not present

## 2022-10-04 DIAGNOSIS — Z1331 Encounter for screening for depression: Secondary | ICD-10-CM | POA: Diagnosis not present

## 2022-10-04 DIAGNOSIS — Z1159 Encounter for screening for other viral diseases: Secondary | ICD-10-CM | POA: Diagnosis not present

## 2022-10-04 DIAGNOSIS — E7800 Pure hypercholesterolemia, unspecified: Secondary | ICD-10-CM | POA: Diagnosis not present

## 2022-10-04 DIAGNOSIS — I10 Essential (primary) hypertension: Secondary | ICD-10-CM | POA: Diagnosis not present

## 2022-10-04 DIAGNOSIS — R7303 Prediabetes: Secondary | ICD-10-CM | POA: Diagnosis not present

## 2022-10-04 DIAGNOSIS — Z0000 Encounter for general adult medical examination without abnormal findings: Secondary | ICD-10-CM | POA: Diagnosis not present

## 2022-10-04 NOTE — Progress Notes (Signed)
Called pt no answer left a vm

## 2022-10-04 NOTE — Progress Notes (Signed)
Patient called back and was made aware of results pt voiced understanding

## 2022-10-05 DIAGNOSIS — R310 Gross hematuria: Secondary | ICD-10-CM | POA: Diagnosis not present

## 2022-10-07 DIAGNOSIS — R5383 Other fatigue: Secondary | ICD-10-CM | POA: Diagnosis not present

## 2022-10-07 DIAGNOSIS — J168 Pneumonia due to other specified infectious organisms: Secondary | ICD-10-CM | POA: Diagnosis not present

## 2022-10-07 DIAGNOSIS — R519 Headache, unspecified: Secondary | ICD-10-CM | POA: Diagnosis not present

## 2022-10-07 DIAGNOSIS — R918 Other nonspecific abnormal finding of lung field: Secondary | ICD-10-CM | POA: Diagnosis not present

## 2022-10-07 DIAGNOSIS — R739 Hyperglycemia, unspecified: Secondary | ICD-10-CM | POA: Diagnosis not present

## 2022-10-07 DIAGNOSIS — Z91041 Radiographic dye allergy status: Secondary | ICD-10-CM | POA: Diagnosis not present

## 2022-10-07 DIAGNOSIS — R319 Hematuria, unspecified: Secondary | ICD-10-CM | POA: Diagnosis not present

## 2022-10-07 DIAGNOSIS — M545 Low back pain, unspecified: Secondary | ICD-10-CM | POA: Diagnosis not present

## 2022-10-07 DIAGNOSIS — Z79899 Other long term (current) drug therapy: Secondary | ICD-10-CM | POA: Diagnosis not present

## 2022-10-07 DIAGNOSIS — G8929 Other chronic pain: Secondary | ICD-10-CM | POA: Diagnosis not present

## 2022-10-07 DIAGNOSIS — R059 Cough, unspecified: Secondary | ICD-10-CM | POA: Diagnosis not present

## 2022-10-07 DIAGNOSIS — Z8616 Personal history of COVID-19: Secondary | ICD-10-CM | POA: Diagnosis not present

## 2022-10-07 DIAGNOSIS — J189 Pneumonia, unspecified organism: Secondary | ICD-10-CM | POA: Diagnosis not present

## 2022-10-07 DIAGNOSIS — R81 Glycosuria: Secondary | ICD-10-CM | POA: Diagnosis not present

## 2022-10-07 DIAGNOSIS — M7918 Myalgia, other site: Secondary | ICD-10-CM | POA: Diagnosis not present

## 2022-10-07 DIAGNOSIS — I10 Essential (primary) hypertension: Secondary | ICD-10-CM | POA: Diagnosis not present

## 2022-10-07 DIAGNOSIS — J984 Other disorders of lung: Secondary | ICD-10-CM | POA: Diagnosis not present

## 2022-10-07 DIAGNOSIS — R000 Tachycardia, unspecified: Secondary | ICD-10-CM | POA: Diagnosis not present

## 2022-10-07 DIAGNOSIS — Z888 Allergy status to other drugs, medicaments and biological substances status: Secondary | ICD-10-CM | POA: Diagnosis not present

## 2022-10-07 DIAGNOSIS — R509 Fever, unspecified: Secondary | ICD-10-CM | POA: Diagnosis not present

## 2022-10-10 DIAGNOSIS — R059 Cough, unspecified: Secondary | ICD-10-CM | POA: Diagnosis not present

## 2022-10-10 DIAGNOSIS — R519 Headache, unspecified: Secondary | ICD-10-CM | POA: Diagnosis not present

## 2022-10-10 DIAGNOSIS — J329 Chronic sinusitis, unspecified: Secondary | ICD-10-CM | POA: Diagnosis not present

## 2022-10-10 DIAGNOSIS — R030 Elevated blood-pressure reading, without diagnosis of hypertension: Secondary | ICD-10-CM | POA: Diagnosis not present

## 2022-10-10 DIAGNOSIS — Z6836 Body mass index (BMI) 36.0-36.9, adult: Secondary | ICD-10-CM | POA: Diagnosis not present

## 2022-10-10 DIAGNOSIS — Z20822 Contact with and (suspected) exposure to covid-19: Secondary | ICD-10-CM | POA: Diagnosis not present

## 2022-10-10 DIAGNOSIS — I10 Essential (primary) hypertension: Secondary | ICD-10-CM | POA: Diagnosis not present

## 2022-10-12 DIAGNOSIS — J329 Chronic sinusitis, unspecified: Secondary | ICD-10-CM | POA: Diagnosis not present

## 2022-10-12 DIAGNOSIS — R519 Headache, unspecified: Secondary | ICD-10-CM | POA: Diagnosis not present

## 2022-10-12 DIAGNOSIS — J029 Acute pharyngitis, unspecified: Secondary | ICD-10-CM | POA: Diagnosis not present

## 2022-10-12 DIAGNOSIS — R059 Cough, unspecified: Secondary | ICD-10-CM | POA: Diagnosis not present

## 2022-10-12 DIAGNOSIS — Z6836 Body mass index (BMI) 36.0-36.9, adult: Secondary | ICD-10-CM | POA: Diagnosis not present

## 2022-10-12 DIAGNOSIS — I10 Essential (primary) hypertension: Secondary | ICD-10-CM | POA: Diagnosis not present

## 2022-10-15 DIAGNOSIS — R002 Palpitations: Secondary | ICD-10-CM | POA: Diagnosis not present

## 2022-10-16 DIAGNOSIS — G4700 Insomnia, unspecified: Secondary | ICD-10-CM | POA: Diagnosis not present

## 2022-10-16 DIAGNOSIS — R053 Chronic cough: Secondary | ICD-10-CM | POA: Diagnosis not present

## 2022-10-18 DIAGNOSIS — M791 Myalgia, unspecified site: Secondary | ICD-10-CM | POA: Diagnosis not present

## 2022-10-18 DIAGNOSIS — R591 Generalized enlarged lymph nodes: Secondary | ICD-10-CM | POA: Diagnosis not present

## 2022-10-18 DIAGNOSIS — M2550 Pain in unspecified joint: Secondary | ICD-10-CM | POA: Diagnosis not present

## 2022-10-18 DIAGNOSIS — B349 Viral infection, unspecified: Secondary | ICD-10-CM | POA: Diagnosis not present

## 2022-10-19 ENCOUNTER — Other Ambulatory Visit: Payer: Medicare Other

## 2022-10-22 ENCOUNTER — Telehealth: Payer: Self-pay | Admitting: Neurology

## 2022-10-22 NOTE — Telephone Encounter (Signed)
Pt called in stating she was in the ED 2 weeks ago. She thought it was a virus. About 2 weeks later everything started. She had a corticosteroid injection earlier the same day the pain started. She cannot move her body. She has a lot of swelling in her hands and legs. Pain in her arms. She hurts so bad she can hardly function. She went from being ok to having to use a lift chair now. She says it feels like it's her muscles. She has been seeing her PCP and they are doing lab tests, but don't know what the problem is yet.

## 2022-10-22 NOTE — Telephone Encounter (Signed)
This seems to be whole body pain, swelling, and weakness, which typically is not a primary neurological condition.  I am happy to assess her but do not have any immediate available.  We can add her to the wait list.  In the meantime, we can request PCP notes.

## 2022-10-23 ENCOUNTER — Ambulatory Visit: Payer: Medicare Other | Attending: Cardiology | Admitting: Cardiology

## 2022-10-23 ENCOUNTER — Encounter: Payer: Self-pay | Admitting: Cardiology

## 2022-10-23 VITALS — BP 146/88 | HR 90 | Resp 16 | Ht 66.0 in | Wt 243.0 lb

## 2022-10-23 DIAGNOSIS — Z6838 Body mass index (BMI) 38.0-38.9, adult: Secondary | ICD-10-CM

## 2022-10-23 DIAGNOSIS — I447 Left bundle-branch block, unspecified: Secondary | ICD-10-CM

## 2022-10-23 DIAGNOSIS — I10 Essential (primary) hypertension: Secondary | ICD-10-CM | POA: Diagnosis not present

## 2022-10-23 DIAGNOSIS — I471 Supraventricular tachycardia, unspecified: Secondary | ICD-10-CM

## 2022-10-23 DIAGNOSIS — R0609 Other forms of dyspnea: Secondary | ICD-10-CM

## 2022-10-23 DIAGNOSIS — Z8616 Personal history of COVID-19: Secondary | ICD-10-CM | POA: Diagnosis not present

## 2022-10-23 DIAGNOSIS — E782 Mixed hyperlipidemia: Secondary | ICD-10-CM

## 2022-10-23 DIAGNOSIS — E66812 Obesity, class 2: Secondary | ICD-10-CM

## 2022-10-23 MED ORDER — METOPROLOL TARTRATE 50 MG PO TABS
ORAL_TABLET | ORAL | Status: DC
Start: 2022-10-23 — End: 2022-11-30

## 2022-10-23 MED ORDER — DILTIAZEM HCL ER COATED BEADS 180 MG PO CP24
180.0000 mg | ORAL_CAPSULE | Freq: Every day | ORAL | 1 refills | Status: DC
Start: 2022-10-23 — End: 2023-01-14

## 2022-10-23 NOTE — Progress Notes (Signed)
Cardiology Office Note:  .   Date:  10/23/2022  ID:  Amanda Bentley, DOB 02/06/1955, MRN [id number] PCP:  Merri Brunette, MD  Former Cardiology Providers: Hedwig Morton, MD  [facility] Providers Cardiologist:  Tessa Lerner, DO , [facility] (established care 07/27/2020) Electrophysiologist:  None  Click to update primary MD,subspecialty MD or APP then REFRESH:1}    History of Present Illness: .   Amanda Bentley is a 67 y.o. Caucasian female whose past medical history and cardiovascular risk factors includes: LBBB, hypertension, prediabetes, osteoarthritis, carpal tunnel syndrome, postmenopausal female, advanced age, obesity due to excess calories.   Patient is being followed on a practice given her symptoms of dyspnea on exertion and palpitations.  Given her palpitations shared decision was to proceed for over this Zio patch to evaluate for dysrhythmias and an echocardiogram to evaluate for LVEF given her dyspnea on exertion.  She wanted to hold off on stress testing at the last office visit.  And her blood pressures are currently being managed by PCP.  Since last office visit, palpitations have remained stable.  However, end of August beginning of September she had COVID-19 infection and she still recovering.  She does have shortness of breath likely secondary to post-COVID.  Review of Systems: .   Review of Systems  Constitutional: Positive for malaise/fatigue.  Cardiovascular:  Negative for chest pain, claudication, dyspnea on exertion, irregular heartbeat, leg swelling, near-syncope, orthopnea, palpitations, paroxysmal nocturnal dyspnea and syncope.  Respiratory:  Positive for cough and shortness of breath (chronic - 3 years ago s/p covid).   Hematologic/Lymphatic: Negative for bleeding problem.  Musculoskeletal:  Negative for muscle cramps and myalgias.  Neurological:  Negative for dizziness and light-headedness.    Studies Reviewed:   EKG: EKG  Interpretation Date/Time:  Tuesday October 23 2022 13:59:16 EDT Ventricular Rate:  90 PR Interval:  164 QRS Duration:  150 QT Interval:  412 QTC Calculation: 504 R Axis:   -49  Text Interpretation: Normal sinus rhythm Left axis deviation Left bundle branch block Consider Left ventricular hypertrophy with QRS widening and repolarization abnormality ( R in aVL , Cornell product ) Inferior infarct , age undetermined When compared with ECG of 25-Jan-2019 00:54, No significant change since last tracing Confirmed by Tessa Lerner ([id number]) on 10/23/2022 2:23:37 PM  Echocardiogram: 09/27/2022: Study Quality: Poor - no subcostal images.  Normal LV systolic function with visual EF 55-60%. Left ventricle cavity is normal in size. Mild concentric hypertrophy of the left ventricle. Normal global wall motion. Presence of a septal bulge. Normal diastolic filling pattern, normal LAP.  Mild (Grade I) mitral regurgitation. Mild tricuspid regurgitation. Compared to 08/09/2021 Grade I diastolic dysfunction is now normal otherwise no significant change.   Coronary calcium score 10/11/2021: 1. No coronary atherosclerotic calcifications. The observed calcium score of 0 is at the zeroth percentile for subjects of the same age, gender, and race/ethnicity who are free of clinical cardiovascular disease and treated diabetes. 2.  Aortic Atherosclerosis (ICD10-I70.0).  Cardiac monitor: Cardiac monitor (Zio Patch): 09/27/2022 - 10/11/2022 Dominant rhythm sinus. Heart rate 54-222.  Bpm.  Avg HR 82 bpm. No atrial fibrillation detected during the monitoring period. No ventricular tachycardia, high grade AV block, pauses (3 seconds or longer). Total supraventricular ectopic burden <1%. Frequent episodes of PSVT noted during the monitoring. Total ventricular ectopic burden <1%. Patient triggered events: 8.   Underlying episodes demonstrated either sinus rhythm or sinus tachycardia with intermittent episodes of  PSVT. Patient triggered event on 10/01/2022 at  4:32 PM illustrated sinus rhythm with 15 beat of PSVT, 4.8 seconds in duration, max HR of 222 bpm and average HR of 96 bpm    Risk Assessment/Calculations:   N/A   Labs:       Latest Ref Rng & Units 08/04/2021   10:43 AM 01/25/2019   12:00 AM 01/12/2014    9:14 AM  CBC  WBC 4.0 - 10.5 K/uL 11.0  10.3    Hemoglobin 12.0 - 15.0 g/dL 86.5  78.4  69.6   Hematocrit 36.0 - 46.0 % 45.6  44.7  43.0   Platelets 150 - 400 K/uL 314  289         Latest Ref Rng & Units 08/04/2021   10:43 AM 01/25/2019   12:00 AM 01/12/2014    9:14 AM  BMP  Glucose 70 - 99 mg/dL 295  284  132   BUN 8 - 23 mg/dL 11  11  10    Creatinine 0.44 - 1.00 mg/dL 4.40  1.02  7.25   Sodium 135 - 145 mmol/L 140  139  142   Potassium 3.5 - 5.1 mmol/L 3.6  3.5  3.5   Chloride 98 - 111 mmol/L 108  104  104   CO2 22 - 32 mmol/L 24  23    Calcium 8.9 - 10.3 mg/dL 9.1  9.2        Latest Ref Rng & Units 08/04/2021   10:43 AM 01/25/2019   12:00 AM 01/12/2014    9:14 AM  CMP  Glucose 70 - 99 mg/dL 366  440  347   BUN 8 - 23 mg/dL 11  11  10    Creatinine 0.44 - 1.00 mg/dL 4.25  9.56  3.87   Sodium 135 - 145 mmol/L 140  139  142   Potassium 3.5 - 5.1 mmol/L 3.6  3.5  3.5   Chloride 98 - 111 mmol/L 108  104  104   CO2 22 - 32 mmol/L 24  23    Calcium 8.9 - 10.3 mg/dL 9.1  9.2      No results found for: "CHOL", "HDL", "LDLCALC", "LDLDIRECT", "TRIG", "CHOLHDL" No results for input(s): "LIPOA" in the last 8760 hours. No components found for: "NTPROBNP" No results for input(s): "PROBNP" in the last 8760 hours. No results for input(s): "TSH" in the last 8760 hours.   Physical Exam:    Today's Vitals   10/23/22 1355  BP: (!) 146/88  Pulse: 90  Resp: 16  SpO2: 94%  Weight: 243 lb (110.2 kg)  Height: 5\' 6"  (1.676 m)   Body mass index is 39.22 kg/m. Wt Readings from Last 3 Encounters:  10/23/22 243 lb (110.2 kg)  08/21/22 241 lb (109.3 kg)  08/07/22 241 lb (109.3  kg)    Physical Exam  Constitutional: No distress.  Age appropriate, hemodynamically stable.   Neck: No JVD present.  Cardiovascular: Normal rate, regular rhythm, S1 normal, S2 normal, intact distal pulses and normal pulses. Exam reveals no gallop, no S3 and no S4.  No murmur heard. Pulmonary/Chest: Effort normal. No stridor. She has bibasilar rales and scattered wheezes. She exhibits no tenderness.  Abdominal: Soft. Bowel sounds are normal. She exhibits no distension. There is no abdominal tenderness.  Musculoskeletal:        General: Edema (trace) present.     Cervical back: Neck supple.  Neurological: She is alert and oriented to person, place, and time. She has intact cranial nerves (2-12).  Skin: Skin is warm  and moist.   Impression & Recommendation(s):  Impression:   ICD-10-CM   1. PSVT (paroxysmal supraventricular tachycardia) (HCC)  I47.10 metoprolol tartrate (LOPRESSOR) 50 MG tablet    diltiazem (CARDIZEM CD) 180 MG 24 hr capsule    2. Dyspnea on exertion  R06.09 EKG 12-Lead    3. History of COVID-19  Z86.16     4. Benign hypertension  I10     5. LBBB (left bundle branch block)  I44.7     6. Mixed hyperlipidemia  E78.2     7. Class 2 severe obesity due to excess calories with serious comorbidity and body mass index (BMI) of 38.0 to 38.9 in adult [facility])  N56.213    E66.01    Z68.38        Recommendation(s):  PSVT (paroxysmal supraventricular tachycardia) (HCC) Given her symptoms of palpitations she did undergo Zio patch she was noted symptomatic episodes of PSVT. Overall accuracy/burden of PSVT could be skewed as she was recovering from COVID-19 infection as well. Patient triggered events noted episodes of sinus rhythm and sinus tachycardia.  She did have symptomatic episode of PSVT lasting 15 beats, 4.8 seconds, with a max heart rate of 222 bpm on 10/01/2022. Currently on Lopressor 50 mg p.o. twice daily. She has been on Lopressor for many years. Would like her to  wean off of Lopressor for now we will start her on diltiazem. Week 1: Lopressor 50 mg p.o. daily. Week 2 Lopressor 25 mg p.o. daily. Week 3 Lopressor 25 mg p.o. every other day. Week 4 stop Lopressor I will have her follow-up with APP in 4 weeks for up titration of diltiazem if needed.  Dyspnea on exertion History of COVID-19 Dyspnea on exertion more exacerbated since last office visit likely secondary to recent COVID-19 infection. She continues to have a nonproductive cough. In the past she has refused stress testing. Continue to monitor symptoms.  Benign hypertension Office blood pressures are above normal limits -likely secondary to recent COVID infection. Currently being managed by PCP.  Mixed hyperlipidemia Given her lipids and elevated 10 risk of ASCVD she did undergo coronary artery calcium score which was noted to be 0.  She would like to hold off on lipid-lowering agents at this time.  Class 2 severe obesity due to excess calories with serious comorbidity and body mass index (BMI) of 38.0 to 38.9 in adult [facility]) Body mass index is 39.22 kg/m. I reviewed with her importance of diet, regular physical activity/exercise, weight loss.   Patient is educated on the importance of increasing physical activity gradually as tolerated with a goal of moderate intensity exercise for 30 minutes a day 5 days a week.  Orders Placed:  Orders Placed This Encounter  Procedures   EKG 12-Lead    As part of medical decision making results of the cardiac monitor were reviewed independently at today's visit.   Final Medication List:    Meds ordered this encounter  Medications   metoprolol tartrate (LOPRESSOR) 50 MG tablet    Sig: Take 1 tablet (50 mg total) by mouth daily for 7 days, THEN 0.5 tablets (25 mg total) daily for 7 days, THEN 0.5 tablets (25 mg total) every other day for 7 days.   diltiazem (CARDIZEM CD) 180 MG 24 hr capsule    Sig: Take 1 capsule (180 mg total) by mouth daily.     Dispense:  30 capsule    Refill:  1    Medications Discontinued During This Encounter  Medication Reason  metoprolol tartrate (LOPRESSOR) 50 MG tablet Change in therapy     Current Outpatient Medications:    ALPRAZolam (XANAX) 0.5 MG tablet, Take 0.5 mg by mouth as needed., Disp: , Rfl:    diltiazem (CARDIZEM CD) 180 MG 24 hr capsule, Take 1 capsule (180 mg total) by mouth daily., Disp: 30 capsule, Rfl: 1   EPINEPHrine 0.3 mg/0.3 mL IJ SOAJ injection, Inject 0.3 mg into the muscle as needed for anaphylaxis., Disp: 1 each, Rfl: 0   losartan (COZAAR) 100 MG tablet, Take 100 mg by mouth daily., Disp: , Rfl:    metFORMIN (GLUCOPHAGE-XR) 500 MG 24 hr tablet, Take by mouth daily with breakfast., Disp: , Rfl:    metoprolol tartrate (LOPRESSOR) 50 MG tablet, Take 1 tablet (50 mg total) by mouth daily for 7 days, THEN 0.5 tablets (25 mg total) daily for 7 days, THEN 0.5 tablets (25 mg total) every other day for 7 days., Disp: , Rfl:    VITAMIN D PO, Take 1 tablet by mouth daily at 12 noon., Disp: , Rfl:    hydrochlorothiazide (HYDRODIURIL) 25 MG tablet, Take 1 tablet (25 mg total) by mouth every morning., Disp: 90 tablet, Rfl: 0  Consent:      NA  Disposition:   Return in about 4 weeks (around 11/20/2022) for Follow up PSVT-up titration of medical therapy. I would like to see her back in 6 months or sooner if needed.  Her questions and concerns were addressed to her satisfaction. She voices understanding of the recommendations provided during this encounter.    Signed, Tessa Lerner, DO, [facility]  [facility]  [address] [place], [place] [address] [phone] 10/23/2022 3:58 PM

## 2022-10-23 NOTE — Telephone Encounter (Signed)
Called patient and informed her of Dr. Eliane Decree advice and recommendations. Patient would like to be placed on a waiting list. Patient is aware we will request notes from her PCP. Patient had no further questions or concerns.

## 2022-10-23 NOTE — Patient Instructions (Signed)
Medication Instructions:  Your physician has recommended you make the following change in your medication:   1) START diltiazem (Cardizem) 180 mg once daily 2) TAPER OFF metoprolol tartrate (Lopressor):      Week 1: Take 50 mg once daily      Week 2: Take 25 mg once daily      Week 3: Take 25 mg every other day      Week 4: STOP taking metoprolol tartrate  *If you need a refill on your cardiac medications before your next appointment, please call your pharmacy*  Lab Work: None ordered today.  Testing/Procedures: None ordered today.  Follow-Up: At [facility], you and your health needs are our priority.  As part of our continuing mission to provide you with exceptional heart care, we have created designated Provider Care Teams.  These Care Teams include your primary Cardiologist (physician) and Advanced Practice Providers (APPs -  Physician Assistants and Nurse Practitioners) who all work together to provide you with the care you need, when you need it.  We recommend signing up for the patient portal called "MyChart".  Sign up information is provided on this After Visit Summary.  MyChart is used to connect with patients for Virtual Visits (Telemedicine).  Patients are able to view lab/test results, encounter notes, upcoming appointments, etc.  Non-urgent messages can be sent to your provider as well.   To learn more about what you can do with MyChart, go to [email or web address].    Your next appointment:   4 week(s) with APP 6 months with Dr. Odis Hollingshead  The format for your next appointment:   In Person  Provider:   Jari Favre, PA-C, Ronie Spies, PA-C, Robin Searing, NP, Tereso Newcomer, PA-C, or Perlie Gold, PA-C     Then, Tessa Lerner, DO will plan to see you again in 6 month(s).

## 2022-10-24 DIAGNOSIS — E6609 Other obesity due to excess calories: Secondary | ICD-10-CM | POA: Diagnosis not present

## 2022-10-24 DIAGNOSIS — R059 Cough, unspecified: Secondary | ICD-10-CM | POA: Diagnosis not present

## 2022-10-24 DIAGNOSIS — Z6836 Body mass index (BMI) 36.0-36.9, adult: Secondary | ICD-10-CM | POA: Diagnosis not present

## 2022-10-24 DIAGNOSIS — R030 Elevated blood-pressure reading, without diagnosis of hypertension: Secondary | ICD-10-CM | POA: Diagnosis not present

## 2022-10-24 DIAGNOSIS — J329 Chronic sinusitis, unspecified: Secondary | ICD-10-CM | POA: Diagnosis not present

## 2022-10-24 DIAGNOSIS — R519 Headache, unspecified: Secondary | ICD-10-CM | POA: Diagnosis not present

## 2022-10-24 DIAGNOSIS — R0602 Shortness of breath: Secondary | ICD-10-CM | POA: Diagnosis not present

## 2022-10-24 NOTE — Telephone Encounter (Signed)
Called patients PCP Dr. Katrinka Blazing and spoke to Lupita Leash who will have records sent over for Dr. Eliane Decree review.

## 2022-10-30 DIAGNOSIS — B349 Viral infection, unspecified: Secondary | ICD-10-CM | POA: Diagnosis not present

## 2022-10-30 DIAGNOSIS — D72829 Elevated white blood cell count, unspecified: Secondary | ICD-10-CM | POA: Diagnosis not present

## 2022-10-30 DIAGNOSIS — R053 Chronic cough: Secondary | ICD-10-CM | POA: Diagnosis not present

## 2022-11-01 ENCOUNTER — Telehealth: Payer: Self-pay | Admitting: Cardiology

## 2022-11-01 NOTE — Telephone Encounter (Signed)
Patient states she has a virus and she would like to discuss adjusting a new medication. Patient declined going further into detail with me.

## 2022-11-01 NOTE — Telephone Encounter (Signed)
Spoke with pt who states she has been ill with ParvoB-19 virus so she has not yet started her Metoprolol taper with change to Diltiazem as recommended by Dr Odis Hollingshead.  She states she will most likely start this change over next week as she is still struggling with body aches/arthritis from the virus.   Pt advised will forward information to Dr Odis Hollingshead and if he has any further recommendations we will let her know.  She has a ROV scheduled for 11/23/2022.  Pt verbalizes understanding and thanked Charity fundraiser for the callback.

## 2022-11-02 DIAGNOSIS — M25512 Pain in left shoulder: Secondary | ICD-10-CM | POA: Diagnosis not present

## 2022-11-02 DIAGNOSIS — M2550 Pain in unspecified joint: Secondary | ICD-10-CM | POA: Diagnosis not present

## 2022-11-02 DIAGNOSIS — M25511 Pain in right shoulder: Secondary | ICD-10-CM | POA: Diagnosis not present

## 2022-11-02 NOTE — Telephone Encounter (Signed)
Spoke with Amanda Bentley and advised of Dr Emelda Brothers recommendations as below.  Amanda Bentley states she is agreeable to delay medication transition as she is still having so many issues with her body due to the virus.  Amanda Bentley advised will cancel appt for 11/08 and requested Amanda Bentley contact office as soon as she decides she is ready to start Metoprolol taper so we can reschedule her f/u appt.  Amanda Bentley verbalizes understanding and thanked Charity fundraiser for the call.

## 2022-11-02 NOTE — Telephone Encounter (Signed)
Please have her recover from her viral infection.  She can delay the transition after she recovers.  Post-pone her follow up w/ APP until she has been on Diltiazem.   Amanda Harps Venetie, DO, [facility]

## 2022-11-07 DIAGNOSIS — M1990 Unspecified osteoarthritis, unspecified site: Secondary | ICD-10-CM | POA: Diagnosis not present

## 2022-11-07 DIAGNOSIS — M79642 Pain in left hand: Secondary | ICD-10-CM | POA: Diagnosis not present

## 2022-11-07 DIAGNOSIS — R700 Elevated erythrocyte sedimentation rate: Secondary | ICD-10-CM | POA: Diagnosis not present

## 2022-11-07 DIAGNOSIS — M2550 Pain in unspecified joint: Secondary | ICD-10-CM | POA: Diagnosis not present

## 2022-11-07 DIAGNOSIS — M79641 Pain in right hand: Secondary | ICD-10-CM | POA: Diagnosis not present

## 2022-11-07 DIAGNOSIS — R5383 Other fatigue: Secondary | ICD-10-CM | POA: Diagnosis not present

## 2022-11-07 DIAGNOSIS — M549 Dorsalgia, unspecified: Secondary | ICD-10-CM | POA: Diagnosis not present

## 2022-11-07 DIAGNOSIS — R748 Abnormal levels of other serum enzymes: Secondary | ICD-10-CM | POA: Diagnosis not present

## 2022-11-08 ENCOUNTER — Ambulatory Visit
Admission: RE | Admit: 2022-11-08 | Discharge: 2022-11-08 | Disposition: A | Discharge status: Home / Self Care | Payer: Medicare Other | Source: Ambulatory Visit | Attending: Neurology | Admitting: Neurology

## 2022-11-08 DIAGNOSIS — M47812 Spondylosis without myelopathy or radiculopathy, cervical region: Secondary | ICD-10-CM | POA: Diagnosis not present

## 2022-11-08 DIAGNOSIS — R292 Abnormal reflex: Secondary | ICD-10-CM

## 2022-11-08 DIAGNOSIS — M47816 Spondylosis without myelopathy or radiculopathy, lumbar region: Secondary | ICD-10-CM

## 2022-11-08 DIAGNOSIS — R29898 Other symptoms and signs involving the musculoskeletal system: Secondary | ICD-10-CM

## 2022-11-14 DIAGNOSIS — R7301 Impaired fasting glucose: Secondary | ICD-10-CM | POA: Diagnosis not present

## 2022-11-19 DIAGNOSIS — M25512 Pain in left shoulder: Secondary | ICD-10-CM | POA: Diagnosis not present

## 2022-11-19 DIAGNOSIS — I10 Essential (primary) hypertension: Secondary | ICD-10-CM | POA: Diagnosis not present

## 2022-11-19 DIAGNOSIS — G9331 Postviral fatigue syndrome: Secondary | ICD-10-CM | POA: Diagnosis not present

## 2022-11-19 DIAGNOSIS — M25511 Pain in right shoulder: Secondary | ICD-10-CM | POA: Diagnosis not present

## 2022-11-21 DIAGNOSIS — R7309 Other abnormal glucose: Secondary | ICD-10-CM | POA: Diagnosis not present

## 2022-11-21 DIAGNOSIS — R5383 Other fatigue: Secondary | ICD-10-CM | POA: Diagnosis not present

## 2022-11-22 DIAGNOSIS — R5383 Other fatigue: Secondary | ICD-10-CM | POA: Diagnosis not present

## 2022-11-22 DIAGNOSIS — M2550 Pain in unspecified joint: Secondary | ICD-10-CM | POA: Diagnosis not present

## 2022-11-22 DIAGNOSIS — R700 Elevated erythrocyte sedimentation rate: Secondary | ICD-10-CM | POA: Diagnosis not present

## 2022-11-22 DIAGNOSIS — M1990 Unspecified osteoarthritis, unspecified site: Secondary | ICD-10-CM | POA: Diagnosis not present

## 2022-11-23 ENCOUNTER — Ambulatory Visit: Payer: Medicare Other | Admitting: Physician Assistant

## 2022-11-27 DIAGNOSIS — R5383 Other fatigue: Secondary | ICD-10-CM | POA: Diagnosis not present

## 2022-11-27 DIAGNOSIS — M316 Other giant cell arteritis: Secondary | ICD-10-CM | POA: Diagnosis not present

## 2022-11-27 DIAGNOSIS — M353 Polymyalgia rheumatica: Secondary | ICD-10-CM | POA: Diagnosis not present

## 2022-11-27 DIAGNOSIS — M2550 Pain in unspecified joint: Secondary | ICD-10-CM | POA: Diagnosis not present

## 2022-11-27 DIAGNOSIS — R700 Elevated erythrocyte sedimentation rate: Secondary | ICD-10-CM | POA: Diagnosis not present

## 2022-11-28 ENCOUNTER — Telehealth: Payer: Self-pay | Admitting: Cardiology

## 2022-11-28 DIAGNOSIS — E7800 Pure hypercholesterolemia, unspecified: Secondary | ICD-10-CM | POA: Diagnosis not present

## 2022-11-28 DIAGNOSIS — E669 Obesity, unspecified: Secondary | ICD-10-CM | POA: Diagnosis not present

## 2022-11-28 DIAGNOSIS — Z6839 Body mass index (BMI) 39.0-39.9, adult: Secondary | ICD-10-CM | POA: Diagnosis not present

## 2022-11-28 DIAGNOSIS — R7303 Prediabetes: Secondary | ICD-10-CM | POA: Diagnosis not present

## 2022-11-28 DIAGNOSIS — Z79899 Other long term (current) drug therapy: Secondary | ICD-10-CM

## 2022-11-28 NOTE — Telephone Encounter (Signed)
Pt c/o medication issue:  1. Name of Medication: Prednisone   2. How are you currently taking this medication (dosage and times per day)?    3. Are you having a reaction (difficulty breathing--STAT)? no  4. What is your medication issue? Patient  has been diagnose with Polymyalgia Rheumatica. So because of that the dr has prescribe her the medication. But it causing her bp to be super high 220/143. Calling to see what the dr thinks she should do. Please advise

## 2022-11-28 NOTE — Telephone Encounter (Signed)
I spoke with patient.  She reports she was started on a large dose of prednisone yesterday for treatment of Polymyalgia Rhumatica.  She also reports being very stressed about this diagnosis.  She went to the fire department last night to have her BP checked and it was 220/143.  She went home and rested and BP came down to 179/103.  She reports PCP would like cardiology to manage her BP.  Patient reports her most recent BP was also 179/103.  She is going to the fire department again this evening to have her BP checked again.  I advised her  she should go to ED if her BP is elevated tonight like it was last evening.  Will forward to Dr Odis Hollingshead for review/recommendations.

## 2022-11-29 NOTE — Telephone Encounter (Signed)
Spoke with pt who reports BP earlier today was 152/86.  She states BP is higher in the evenings and feels it is related to husband suddenly passing away 2 months ago and Prednisone she is taking for PMR.  Pt is currently taking Metoprolol 100mg  - 1/2 tablet by mouth bid and Losartan 100mg  - 1 tablet by mouth in the am.  Pt states she decided not to start Diltiazem 182mg  daily as previously recommended by Dr Odis Hollingshead.  She has not taken hydrochlorothiazide either in quite sometime.    Pt advised will forward to Dr Odis Hollingshead for review and further recommendation.  Pt verbalizes understanding and agrees with current plan.

## 2022-11-29 NOTE — Telephone Encounter (Signed)
Please reconcile her BP meds. What and when she takes her ARB, hydrochlorothiazide.   Last visit we were weaning her off of metoprolol - see if she is off.   Lynnsey Barbara [place], DO, [facility]

## 2022-11-29 NOTE — Telephone Encounter (Signed)
Pt calling back.Pt is a little nervous about this situation and I know Dennie Bible is not here, can you someone else call her back please.

## 2022-11-30 ENCOUNTER — Other Ambulatory Visit: Payer: Self-pay

## 2022-11-30 DIAGNOSIS — R0609 Other forms of dyspnea: Secondary | ICD-10-CM

## 2022-11-30 DIAGNOSIS — I10 Essential (primary) hypertension: Secondary | ICD-10-CM

## 2022-11-30 DIAGNOSIS — I471 Supraventricular tachycardia, unspecified: Secondary | ICD-10-CM

## 2022-11-30 MED ORDER — HYDROCHLOROTHIAZIDE 25 MG PO TABS
25.0000 mg | ORAL_TABLET | Freq: Every morning | ORAL | 3 refills | Status: AC
Start: 2022-11-30 — End: 2023-05-08

## 2022-11-30 MED ORDER — METOPROLOL TARTRATE 50 MG PO TABS: 50.0000 mg | ORAL_TABLET | Freq: Two times a day (BID) | ORAL | 3 refills | Status: AC

## 2022-11-30 NOTE — Telephone Encounter (Signed)
Spoke with Amanda Bentley who was at a gas station at the time. Told her that Dr Odis Hollingshead would like her to continue Lopressor 50 mg twice daily, restart her hydrochlorothiazide 25mg  in the am, and to take losartan at night.  Amanda Bentley stated she has already took her losartan and 1/2 tablet of metoprolol (50mg ) today. Told her she could take the other metoprolol (50 mg) tonight. Sent in metoprolol and htcz to pharmacy for her to possibly pick up tonight. Told her she would need to come in for blood work in one week. Amanda Bentley agrees to plan.  Lab ordered and released.

## 2022-11-30 NOTE — Telephone Encounter (Signed)
Continue Lopressor 50mg  po bid.  Restart her hydrochlorothiazide 25mg  po qam  Take losartan at pm  Check BMP in one week to check Cr and K.   Willys Salvino Kingston, DO, [facility]

## 2022-12-05 ENCOUNTER — Ambulatory Visit: Payer: Medicare Other | Admitting: Neurology

## 2022-12-05 ENCOUNTER — Encounter: Payer: Self-pay | Admitting: Neurology

## 2022-12-05 VITALS — BP 140/71 | HR 72 | Ht 66.0 in | Wt 235.0 lb

## 2022-12-05 DIAGNOSIS — M47816 Spondylosis without myelopathy or radiculopathy, lumbar region: Secondary | ICD-10-CM

## 2022-12-05 DIAGNOSIS — M47812 Spondylosis without myelopathy or radiculopathy, cervical region: Secondary | ICD-10-CM | POA: Diagnosis not present

## 2022-12-05 NOTE — Progress Notes (Signed)
Follow-up Visit   Date: 12/05/2022    ILIAN Bentley MRN: [id number] DOB: [birth year]    Amanda Bentley is a 67 y.o. right-handed Caucasian female with GERD and hypertension returning to the clinic for follow-up of bilateral leg weakness.  The patient was accompanied to the clinic by self.   IMPRESSION/PLAN: Multilevel cervical spondylosis with biforaminal stenosis, asymptomatic. Lumbar spondylosis with right L3-4 and L4-5 lateral recess stenosis, asymptomatic.  PT declined, she may reconsider in the future. She was diagnosed with PMR and much of her generalized weakness and pain has improved with steroids, which is followed by rheumatology.  Return to clinic as needed  --------------------------------------------- History of present illness: She had COVID in August 2022 and reports having generalized fatigue since this time.  Her husband has Parkinson's disease and is in hospice.  She endorses feeling tired all the time, even after getting adequate rest within an hour of being awake, she wants to sleep again.  She has low energy and low mood.  She feels weakness throughout, especially in the legs. "Legs feel like they can collapse".  She walks unassisted and has not had any falls, but feels unsteady. NCS/EMG of the right arm and leg from February 2024 was normal.  She has low back pain for many years.  No numbness/tingling or shooting pain.    UPDATE 08/07/2022: She is here for follow-up visit.  Her husband passed away a few months ago.  She was unable to do PT because of cost.  She continues to have weakness in the legs which is worse with exertion, especially in the right leg.  She endorses low back pain.  She is normally very active and has been unable to keep up with her normal activities, such as keeping up with her yard and home. MRI lumbar spine from May 2024 shows osteophyte complex at the right L3-4 and L4-5 with lateral recess stenosis, no severe canal stenosis.    UPDATE 12/05/2022:  She is here for follow-up visit.  She was diagnosed with polymyalgia rheumatica by Dr. Deanne Coffer and started on high dose steroids.  She reports having resolution of shoulder pain and improved generalized weakness.  However, she has noticed that her blood pressure and blood sugars are harder to control since being on prednisone. She denies radicular arm or leg pain.   Medications:  Current Outpatient Medications on File Prior to Visit  Medication Sig Dispense Refill   ALPRAZolam (XANAX) 0.5 MG tablet Take 0.5 mg by mouth as needed.     diltiazem (CARDIZEM CD) 180 MG 24 hr capsule Take 1 capsule (180 mg total) by mouth daily. 30 capsule 1   EPINEPHrine 0.3 mg/0.3 mL IJ SOAJ injection Inject 0.3 mg into the muscle as needed for anaphylaxis. 1 each 0   hydrochlorothiazide (HYDRODIURIL) 25 MG tablet Take 1 tablet (25 mg total) by mouth every morning. 90 tablet 3   losartan (COZAAR) 100 MG tablet Take 100 mg by mouth daily.     metFORMIN (GLUCOPHAGE-XR) 500 MG 24 hr tablet Take by mouth daily with breakfast.     methylPREDNISolone (MEDROL) 4 MG tablet Take by mouth.     metoprolol tartrate (LOPRESSOR) 50 MG tablet Take 1 tablet (50 mg total) by mouth 2 (two) times daily. 180 tablet 3   predniSONE (DELTASONE) 5 MG tablet Take by mouth. 48 mg daily     VITAMIN D PO Take 1 tablet by mouth daily at 12 noon.     predniSONE (STERAPRED  UNI-PAK 21 TAB) 10 MG (21) TBPK tablet Take by mouth. (Patient not taking: Reported on 12/05/2022)     No current facility-administered medications on file prior to visit.    Allergies:  Allergies  Allergen Reactions   Benadryl [Diphenhydramine] Hives   Iohexol      Desc: pt had swelling from iv contrast yrs ago. pt also does not like to take prednisone for it's side effects. she experiences facial tingling and the benadryl exacerbates this. did fine w/ all premeds and contrast., Onset Date: [id number]     Vital Signs:  BP (!) 140/71   Pulse 72    Ht 5\' 6"  (1.676 m)   Wt 235 lb (106.6 kg)   SpO2 97%   BMI 37.93 kg/m    Neurological Exam: MENTAL STATUS including orientation to time, place, person, recent and remote memory, attention span and concentration, language, and fund of knowledge is normal.  Speech is not dysarthric.  CRANIAL NERVES:   Pupils equal round and reactive to light.  Normal conjugate, extra-ocular eye movements in all directions of gaze.  No ptosis.  Face is symmetric. Palate elevates symmetrically.  Tongue is midline.  MOTOR:  Motor strength is 5/5 in all extremities.  No atrophy, fasciculations or abnormal movements.  No pronator drift.  Tone is normal.    MSRs:  Reflexes are 2+/4 throughout except 3+/4 bilateral patella.  SENSORY:  Intact to vibration throughout.  COORDINATION/GAIT:  Normal finger-to- nose-finger.  Intact rapid alternating movements bilaterally.  She is able to stand up from chair without using arms. Gait narrow based and stable.   Data: NCS/EMG of the right arm and legs 02/23/2022: This is a normal study of the right upper and lower extremities. In particular, there is no evidence of a diffuse myopathy, neuropathy, or cervical/lumbosacral radiculopathy.   MRI lumbar spine wo contrast 06/07/2022: 1. Multilevel lumbar spondylosis, progressed since 2008 and most significant at L4-5 where there is displacement of the traversing right L5 nerve root in the lateral recess and moderate right neural foraminal narrowing, worse from prior. 2. At L3-4, right eccentric disc-osteophyte complex and facet arthropathy results in displacement of the traversing right L4 nerve root in the lateral recess. 3. L5-S1 moderate bilateral facet arthropathy with periarticular edema, which can be a cause of back pain.    MRI cervical spine wo contrast 12/03/2022: C2-3: Minimal disc bulge. Mild bilateral facet arthropathy. Mild bilateral foraminal narrowing. No central canal stenosis.   C3-4: Mild disc bulge with a  small central disc protrusion contacting ventral cervical spinal cord. Severe left facet arthropathy and mild right facet arthropathy. Bilateral uncovertebral degenerative changes. Severe bilateral foraminal stenosis. Mild central canal stenosis.   C4-5: Mild disc bulge. Severe left facet arthropathy. Bilateral uncovertebral degenerative changes. Mild right foraminal stenosis. Severe left foraminal stenosis.   C5-6: Broad-based disc osteophyte complex. Bilateral uncovertebral degenerative changes and mild bilateral facet arthropathy. Moderate right and severe left foraminal stenosis. Mild central canal stenosis.   C6-7: Mild disc bulge. Mild bilateral facet arthropathy. Bilateral uncovertebral degenerative changes. Moderate-severe right foraminal stenosis. Moderate left foraminal stenosis. Small bilateral perineural cyst. No central canal stenosis.   C7-T1: Mild disc osteophyte complex. Bilateral uncovertebral degenerative changes. Severe bilateral foraminal stenosis. No central canal stenosis.   IMPRESSION: 1. Diffuse cervical spine spondylosis as described above. 2. No acute osseous injury of the cervical spine.     Thank you for allowing me to participate in patient's care.  If I can answer any additional questions,  I would be pleased to do so.    Sincerely,    Francine Hannan K. Allena Katz, DO

## 2022-12-06 DIAGNOSIS — M353 Polymyalgia rheumatica: Secondary | ICD-10-CM | POA: Diagnosis not present

## 2022-12-06 DIAGNOSIS — M316 Other giant cell arteritis: Secondary | ICD-10-CM | POA: Diagnosis not present

## 2022-12-06 DIAGNOSIS — M2550 Pain in unspecified joint: Secondary | ICD-10-CM | POA: Diagnosis not present

## 2022-12-06 DIAGNOSIS — R700 Elevated erythrocyte sedimentation rate: Secondary | ICD-10-CM | POA: Diagnosis not present

## 2022-12-06 DIAGNOSIS — R5383 Other fatigue: Secondary | ICD-10-CM | POA: Diagnosis not present

## 2022-12-07 ENCOUNTER — Other Ambulatory Visit: Payer: Medicare Other

## 2022-12-10 ENCOUNTER — Ambulatory Visit: Payer: Medicare Other | Attending: Cardiology

## 2022-12-10 DIAGNOSIS — Z79899 Other long term (current) drug therapy: Secondary | ICD-10-CM | POA: Diagnosis not present

## 2022-12-11 DIAGNOSIS — H1013 Acute atopic conjunctivitis, bilateral: Secondary | ICD-10-CM | POA: Diagnosis not present

## 2022-12-11 DIAGNOSIS — H40033 Anatomical narrow angle, bilateral: Secondary | ICD-10-CM | POA: Diagnosis not present

## 2022-12-11 LAB — BASIC METABOLIC PANEL
BUN/Creatinine Ratio: 23 (ref 12–28)
BUN: 21 mg/dL (ref 8–27)
CO2: 22 mmol/L (ref 20–29)
Calcium: 9.4 mg/dL (ref 8.7–10.3)
Chloride: 100 mmol/L (ref 96–106)
Creatinine, Ser: 0.91 mg/dL (ref 0.57–1.00)
Glucose: 84 mg/dL (ref 70–99)
Potassium: 3.5 mmol/L (ref 3.5–5.2)
Sodium: 139 mmol/L (ref 134–144)
eGFR: 69 mL/min/{1.73_m2} (ref >59)

## 2022-12-11 NOTE — Progress Notes (Signed)
Sent message, via epic in basket, requesting orders in epic from surgeon.  

## 2022-12-18 ENCOUNTER — Ambulatory Visit: Payer: Medicare Other | Admitting: Neurology

## 2022-12-18 DIAGNOSIS — M353 Polymyalgia rheumatica: Secondary | ICD-10-CM | POA: Diagnosis not present

## 2022-12-18 DIAGNOSIS — R700 Elevated erythrocyte sedimentation rate: Secondary | ICD-10-CM | POA: Diagnosis not present

## 2022-12-18 DIAGNOSIS — M2550 Pain in unspecified joint: Secondary | ICD-10-CM | POA: Diagnosis not present

## 2022-12-18 DIAGNOSIS — R5383 Other fatigue: Secondary | ICD-10-CM | POA: Diagnosis not present

## 2022-12-18 DIAGNOSIS — M316 Other giant cell arteritis: Secondary | ICD-10-CM | POA: Diagnosis not present

## 2022-12-18 NOTE — Progress Notes (Addendum)
Second request for pre op orders sent message via epic to Dr. Sheliah Hatch.

## 2022-12-19 NOTE — Progress Notes (Signed)
COVID Vaccine received:  [x]  No []  Yes Date of any COVID positive Test in last 90 days:  none  PCP - Merri Brunette MD [phone] (Work)  [phone] (Fax)  Cardiologist -  Tessa Lerner, DO Neurology- Nita Sickle, DO Rheumatology- Casimer Lanius, MD   Chest x-ray - 01-20-2021  2v  Epic EKG - 10-23-2022  Epic  Stress Test -  ECHO - 09-27-2022  Epic Cardiac Cath -  Zio Monitor- 09-27-22  Epic  PCR screen: []  Ordered & Completed []   No Order but Needs PROFEND     [x]   N/A for this surgery  Surgery Plan:  [x]  Ambulatory   []  Outpatient in bed  []  Admit Anesthesia:    []  General  []  Spinal  []   Choice [x]   MAC  Pacemaker / ICD device [x]  No []  Yes   Spinal Cord Stimulator:[x]  No []  Yes       History of Sleep Apnea? [x]  No []  Yes   CPAP used?- [x]  No []  Yes    Does the patient monitor blood sugar?   []  N/A   [x]  No []  Yes  Patient has: []  NO Hx DM   [x]  Pre-DM   []  DM1  []   DM2 Has Freestyle sensor left arm;   Reading at PST  156 Last A1c was: 5.8? Per patient Other Diabetic medications/ instructions: Metformin is being held d/t taking Steroids  Blood Thinner / Instructions:  none Aspirin Instructions:  none  ERAS Protocol Ordered: [x]  No  []  Yes PRE-SURGERY []  ENSURE  []  G2   []  No Drink Ordered Patient is to be NPO after: Midnight   Dental hx: []  Dentures:  []  N/A      []  Bridge or Partial:                   [x]  Loose or Damaged teeth: damaged crown on right lower side  Comments:   NO ORDERS at PST Dr. Sheliah Hatch is aware that patient is on Medrol and she will continue- hold DOS   per Steward Drone   Activity level: Patient is able / unable to climb a flight of stairs without difficulty Patient can perform ADLs without assistance.   Anesthesia review: Pre-DM, HTN, LBBB, PONV, GERD, Polymyalgia Rheumatica, PSVT  Patient denies shortness of breath, fever, cough and chest pain at PAT appointment.  Patient verbalized understanding and agreement to the Pre-Surgical Instructions that  were given to them at this PAT appointment. Patient was also educated of the need to review these PAT instructions again prior to her surgery.I reviewed the appropriate phone numbers to call if they have any and questions or concerns.

## 2022-12-20 ENCOUNTER — Other Ambulatory Visit: Payer: Self-pay

## 2022-12-20 ENCOUNTER — Encounter (HOSPITAL_COMMUNITY): Payer: Self-pay

## 2022-12-20 ENCOUNTER — Encounter (HOSPITAL_COMMUNITY)
Admission: RE | Admit: 2022-12-20 | Discharge: 2022-12-20 | Disposition: A | Discharge status: Home / Self Care | Payer: Medicare Other | Source: Ambulatory Visit | Attending: General Surgery | Admitting: General Surgery

## 2022-12-20 VITALS — BP 190/88 | HR 77 | Temp 98.0°F | Resp 20 | Ht 66.0 in | Wt 234.0 lb

## 2022-12-20 DIAGNOSIS — I447 Left bundle-branch block, unspecified: Secondary | ICD-10-CM | POA: Diagnosis not present

## 2022-12-20 DIAGNOSIS — E669 Obesity, unspecified: Secondary | ICD-10-CM | POA: Diagnosis not present

## 2022-12-20 DIAGNOSIS — K219 Gastro-esophageal reflux disease without esophagitis: Secondary | ICD-10-CM | POA: Insufficient documentation

## 2022-12-20 DIAGNOSIS — I10 Essential (primary) hypertension: Secondary | ICD-10-CM | POA: Diagnosis not present

## 2022-12-20 DIAGNOSIS — Z6837 Body mass index (BMI) 37.0-37.9, adult: Secondary | ICD-10-CM | POA: Diagnosis not present

## 2022-12-20 DIAGNOSIS — Z01812 Encounter for preprocedural laboratory examination: Secondary | ICD-10-CM | POA: Insufficient documentation

## 2022-12-20 DIAGNOSIS — R7301 Impaired fasting glucose: Secondary | ICD-10-CM | POA: Diagnosis not present

## 2022-12-20 DIAGNOSIS — F419 Anxiety disorder, unspecified: Secondary | ICD-10-CM | POA: Insufficient documentation

## 2022-12-20 DIAGNOSIS — Z01818 Encounter for other preprocedural examination: Secondary | ICD-10-CM

## 2022-12-20 DIAGNOSIS — M353 Polymyalgia rheumatica: Secondary | ICD-10-CM | POA: Insufficient documentation

## 2022-12-20 DIAGNOSIS — R7309 Other abnormal glucose: Secondary | ICD-10-CM | POA: Diagnosis not present

## 2022-12-20 HISTORY — DX: Unspecified osteoarthritis, unspecified site: M19.90

## 2022-12-20 HISTORY — DX: Other specified cardiac arrhythmias: I49.8

## 2022-12-20 HISTORY — DX: Myoneural disorder, unspecified: G70.9

## 2022-12-20 HISTORY — DX: Anxiety disorder, unspecified: F41.9

## 2022-12-20 LAB — CBC
HCT: 44 % (ref 36.0–46.0)
Hemoglobin: 14.6 g/dL (ref 12.0–15.0)
MCH: 31.8 pg (ref 26.0–34.0)
MCHC: 33.2 g/dL (ref 30.0–36.0)
MCV: 95.9 fL (ref 80.0–100.0)
Platelets: 253 10*3/uL (ref 150–400)
RBC: 4.59 MIL/uL (ref 3.87–5.11)
RDW: 14 % (ref 11.5–15.5)
WBC: 15.9 10*3/uL — ABNORMAL HIGH (ref 4.0–10.5)
nRBC: 0 % (ref 0.0–0.2)

## 2022-12-20 NOTE — Patient Instructions (Signed)
SURGICAL WAITING ROOM VISITATION Patients having surgery or a procedure may have no more than 2 support people in the waiting area - these visitors may rotate in the visitor waiting room.   Due to an increase in RSV and influenza rates and associated hospitalizations, children ages 52 and under may not visit patients in [facility] hospitals. If the patient needs to stay at the hospital during part of their recovery, the visitor guidelines for inpatient rooms apply.  PRE-OP VISITATION  Pre-op nurse will coordinate an appropriate time for 1 support person to accompany the patient in pre-op.  This support person may not rotate.  This visitor will be contacted when the time is appropriate for the visitor to come back in the pre-op area.  Please refer to the [facility] website for the visitor guidelines for Inpatients (after your surgery is over and you are in a regular room).  You are not required to quarantine at this time prior to your surgery. However, you must do this: Hand Hygiene often Do NOT share personal items Notify your provider if you are in close contact with someone who has COVID or you develop fever 100.4 or greater, new onset of sneezing, cough, sore throat, shortness of breath or body aches.  If you test positive for Covid or have been in contact with anyone that has tested positive in the last 10 days please notify you surgeon.    Your procedure is scheduled on:  Tuesday  December 25, 2022  Report to [facility] Main Entrance: [place] entrance where the [employer] is available.   Report to admitting at:  5:15   AM  Call this number if you have any questions or problems the morning of surgery [phone]  DO NOT EAT OR DRINK ANYTHING AFTER MIDNIGHT THE NIGHT PRIOR TO YOUR SURGERY / PROCEDURE.   FOLLOW  ANY ADDITIONAL PRE OP INSTRUCTIONS YOU RECEIVED FROM YOUR SURGEON'S OFFICE!!!   Oral Hygiene is also important to reduce your risk of infection.         Remember - BRUSH YOUR TEETH THE MORNING OF SURGERY WITH YOUR REGULAR TOOTHPASTE  Do NOT smoke after Midnight the night before surgery.  STOP TAKING all Vitamins, Herbs and supplements 1 week before your surgery.   Take ONLY these medicines the morning of surgery with A SIP OF WATER: Metoprolol,  you may take Alprazolam (Xanax) if needed.                       You may not have any metal on your body including hair pins, jewelry, and body piercing  Do not wear make-up, lotions, powders, perfumes  or deodorant  Do not wear nail polish including gel and S&S, artificial / acrylic nails, or any other type of covering on natural nails including finger and toenails. If you have artificial nails, gel coating, etc., that needs to be removed by a nail salon, Please have this removed prior to surgery. Not doing so may mean that your surgery could be cancelled or delayed if the Surgeon or anesthesia staff feels like they are unable to monitor you safely.   Do not shave 48 hours prior to surgery to avoid nicks in your skin which may contribute to postoperative infections.   Men may shave face and neck.  Contacts, Hearing Aids, dentures or bridgework may not be worn into surgery. DENTURES WILL BE REMOVED PRIOR TO SURGERY PLEASE DO NOT APPLY "Poly grip" OR ADHESIVES!!!  You may bring a small overnight bag with you on the day of surgery, only pack items that are not valuable. [facility] IS NOT RESPONSIBLE   FOR VALUABLES THAT ARE LOST OR STOLEN.   Patients discharged on the day of surgery will not be allowed to drive home.  Someone NEEDS to stay with you for the first 24 hours after anesthesia.  Do not bring your home medications to the hospital. The Pharmacy will dispense medications listed on your medication list to you during your admission in the Hospital.  Special Instructions: Bring a copy of your healthcare power of attorney and living will documents the day of surgery, if you wish to have them  scanned into your [facility] Records- EPIC  Please read over the following fact sheets you were given: IF YOU HAVE QUESTIONS ABOUT YOUR PRE-OP INSTRUCTIONS, PLEASE CALL [phone].   [facility] - Preparing for Surgery Before surgery, you can play an important role.  Because skin is not sterile, your skin needs to be as free of germs as possible.  You can reduce the number of germs on your skin by washing with CHG (chlorahexidine gluconate) soap before surgery.  CHG is an antiseptic cleaner which kills germs and bonds with the skin to continue killing germs even after washing. Please DO NOT use if you have an allergy to CHG or antibacterial soaps.  If your skin becomes reddened/irritated stop using the CHG and inform your nurse when you arrive at Short Stay. Do not shave (including legs and underarms) for at least 48 hours prior to the first CHG shower.  You may shave your face/neck.  Please follow these instructions carefully:  1.  Shower with CHG Soap the night before surgery and the  morning of surgery.  2.  If you choose to wash your hair, wash your hair first as usual with your normal  shampoo.  3.  After you shampoo, rinse your hair and body thoroughly to remove the shampoo.                             4.  Use CHG as you would any other liquid soap.  You can apply chg directly to the skin and wash.  Gently with a scrungie or clean washcloth.  5.  Apply the CHG Soap to your body ONLY FROM THE NECK DOWN.   Do not use on face/ open                           Wound or open sores. Avoid contact with eyes, ears mouth and genitals (private parts).                       Wash face,  Genitals (private parts) with your normal soap.             6.  Wash thoroughly, paying special attention to the area where your  surgery  will be performed.  7.  Thoroughly rinse your body with warm water from the neck down.  8.  DO NOT shower/wash with your normal soap after using and rinsing off the CHG  Soap.            9.  Pat yourself dry with a clean towel.            10.  Wear clean pajamas.  11.  Place clean sheets on your bed the night of your first shower and do not  sleep with pets.  ON THE DAY OF SURGERY : Do not apply any lotions/deodorants the morning of surgery.  Please wear clean clothes to the hospital/surgery center.    FAILURE TO FOLLOW THESE INSTRUCTIONS MAY RESULT IN THE CANCELLATION OF YOUR SURGERY  PATIENT SIGNATURE_________________________________  NURSE SIGNATURE__________________________________  ________________________________________________________________________

## 2022-12-21 ENCOUNTER — Other Ambulatory Visit: Payer: Self-pay

## 2022-12-21 ENCOUNTER — Telehealth: Payer: Self-pay

## 2022-12-21 MED ORDER — HYDRALAZINE HCL 25 MG PO TABS: 25.0000 mg | ORAL_TABLET | Freq: Three times a day (TID) | ORAL | 2 refills | Status: DC

## 2022-12-21 NOTE — Telephone Encounter (Signed)
Spoke with pt regarding lab results and Dr. Odis Hollingshead result note. Pt stated she has been on high dose of prednisone prescribed by her rheumatologist for awhile now due to some other medical issues. She is having an arterial biopsy to diagnose giant cell arteritis (GCA). Since she has been on the prednisone, her BP has been as high as 180s/90s. No CP, has had some palpitations. Pt wanted to let Dr. Odis Hollingshead know she is having this procedure and to let him know about her BP readings. Message has been forwarded to Dr. Odis Hollingshead.

## 2022-12-21 NOTE — Progress Notes (Signed)
Per Dr. Odis Hollingshead, order for Hydralazine 25 mg TID ordered and pt is aware.

## 2022-12-24 ENCOUNTER — Encounter (HOSPITAL_COMMUNITY): Payer: Self-pay

## 2022-12-24 ENCOUNTER — Ambulatory Visit: Payer: Self-pay | Admitting: General Surgery

## 2022-12-24 NOTE — Anesthesia Preprocedure Evaluation (Signed)
Anesthesia Evaluation  Patient identified by MRN, date of birth, ID band Patient awake    Reviewed: Allergy & Precautions, NPO status , Patient's Chart, lab work & pertinent test results  History of Anesthesia Complications (+) PONV and history of anesthetic complications  Airway Mallampati: III  TM Distance: >3 FB Neck ROM: Full    Dental   Pulmonary neg pulmonary ROS   breath sounds clear to auscultation       Cardiovascular hypertension, Pt. on home beta blockers and Pt. on medications + dysrhythmias (LBBB)  Rhythm:Regular Rate:Normal     Neuro/Psych  Headaches  Neuromuscular disease    GI/Hepatic Neg liver ROS,GERD  ,,  Endo/Other  negative endocrine ROS    Renal/GU negative Renal ROS     Musculoskeletal  (+) Arthritis ,    Abdominal   Peds  Hematology negative hematology ROS (+)   Anesthesia Other Findings   Reproductive/Obstetrics                             Anesthesia Physical Anesthesia Plan  ASA: 2  Anesthesia Plan: MAC   Post-op Pain Management: Ofirmev IV (intra-op)* and Minimal or no pain anticipated   Induction:   PONV Risk Score and Plan: 3 and Propofol infusion, Dexamethasone, Ondansetron and Treatment may vary due to age or medical condition  Airway Management Planned: Natural Airway, Simple Face Mask and Nasal Cannula  Additional Equipment:   Intra-op Plan:   Post-operative Plan:   Informed Consent: I have reviewed the patients History and Physical, chart, labs and discussed the procedure including the risks, benefits and alternatives for the proposed anesthesia with the patient or authorized representative who has indicated his/her understanding and acceptance.     Dental advisory given  Plan Discussed with: CRNA  Anesthesia Plan Comments: ( )        Anesthesia Quick Evaluation

## 2022-12-24 NOTE — Progress Notes (Signed)
DISCUSSION: Amanda Bentley is a 67 yo female who presents to PAT prior to BIOPSY TEMPORAL ARTERY. PMH significant for HTN, GERD, LBBB, PSVT, PMR, anxiety, obesity (BMI 37), chronic back pain.  Prior anesthesia complications include PONV, prolonged emergence? (Note says patient had lethargy for 2 weeks post op)  Patient has hx of PMR and is followed by Rheumatology. She has been put on steroids with improvement in her symptoms. Now referred for biopsy of temporal artery d/t headaches and jaw claudication  Patient follows with Cardiology for LBBB, SVT, chronic SOB. Last seen on 10/23/22. She underwent holter monitoring for palpitations which showed frequent episodes of PSVT. Also had an echo which was overall reassuring. Takes Cardizem and Metoprolol.  Follows with PCP. Last seen on 12/11/22. Noted to be having episodes of hypoglycemia and referred to a nutritionist.   VS: BP (!) 190/88 Comment: left arm sitting  Pulse 77   Temp 36.7 C   Resp 20   Ht 5\' 6"  (1.676 m)   Wt 106.1 kg   SpO2 96%   BMI 37.77 kg/m   PROVIDERS: Merri Brunette, MD Rheumatology: Casimer Lanius, MD  LABS: Labs reviewed: Acceptable for surgery. WBC elevated - pt on chronic steroids (all labs ordered are listed, but only abnormal results are displayed)  Labs Reviewed  CBC - Abnormal; Notable for the following components:      Result Value   WBC 15.9 (*)    All other components within normal limits     IMAGES:   EKG 10/23/22:  Normal sinus rhythm, rate 90 Left axis deviation Left bundle branch block Consider Left ventricular hypertrophy with QRS widening and repolarization abnormality ( R in aVL , Cornell product ) Inferior infarct , age undetermined When compared with ECG of 25-Jan-2019 00:54, No significant change since last tracing Confirmed by Tessa Lerner [phone]) on 10/23/2022 2:23:37 PMa  CV:  Cardiac monitor (Zio Patch): 09/27/2022 - 10/11/2022 Dominant rhythm sinus. Heart rate 54-222.   Bpm.  Avg HR 82 bpm. No atrial fibrillation detected during the monitoring period. No ventricular tachycardia, high grade AV block, pauses (3 seconds or longer). Total supraventricular ectopic burden <1%. Frequent episodes of PSVT noted during the monitoring. Total ventricular ectopic burden <1%. Patient triggered events: 8.   Underlying episodes demonstrated either sinus rhythm or sinus tachycardia with intermittent episodes of PSVT. Patient triggered event on 10/01/2022 at 4:32 PM illustrated sinus rhythm with 15 beat of PSVT, 4.8 seconds in duration, max HR of 222 bpm and average HR of 96 bpm  Echo 09/27/22:  Echocardiogram 09/27/2022: Study Quality: Poor - no subcostal images. Normal LV systolic function with visual EF 55-60%. Left ventricle cavity is normal in size. Mild concentric hypertrophy of the left ventricle. Normal global wall motion. Presence of a septal bulge. Normal diastolic filling pattern, normal LAP. Mild (Grade I) mitral regurgitation. Mild tricuspid regurgitation. Compared to 08/09/2021 Grade I diastolic dysfunction is now normal otherwise no significant change.   Coronary calcium score 10/11/2021: 1. No coronary atherosclerotic calcifications. The observed calcium score of 0 is at the zeroth percentile for subjects of the same age, gender, and race/ethnicity who are free of clinical cardiovascular disease and treated diabetes. 2.  Aortic Atherosclerosis (ICD10-I70.0).   Past Medical History:  Diagnosis Date   Anxiety    Arthritis    polymyalgia rheumatica   Complication of anesthesia    problems waking up-3/15   Endometriosis    GERD (gastroesophageal reflux disease)    Hypertension    LBBB (  left bundle branch block)    Neuromuscular disorder (HCC)    Other specified cardiac arrhythmias    PSVT   PONV (postoperative nausea and vomiting)     Past Surgical History:  Procedure Laterality Date   CARPAL TUNNEL RELEASE Right 03/31/2013   Procedure:  RIGHT CARPAL TUNNEL RELEASE;  Surgeon: Wyn Forster., MD;  Location: [facility];  Service: Orthopedics;  Laterality: Right;   CARPAL TUNNEL RELEASE Left 01/12/2014   Procedure: LEFT CARPAL TUNNEL RELEASE;  Surgeon: Cindee Salt, MD;  Location:  SURGERY CENTER;  Service: Orthopedics;  Laterality: Left;   COLONOSCOPY     DILATION AND CURETTAGE OF UTERUS     ERCP     EYE SURGERY     lt cataract 12/15   EYE SURGERY     rt cataract 01/06/14   LAPAROSCOPY  [id number]   explor lap   UPPER GI ENDOSCOPY      MEDICATIONS:  ALPRAZolam (XANAX) 0.5 MG tablet   Cholecalciferol (VITAMIN D) 50 MCG (2000 UT) tablet   diltiazem (CARDIZEM CD) 180 MG 24 hr capsule   hydrALAZINE (APRESOLINE) 25 MG tablet   hydrochlorothiazide (HYDRODIURIL) 25 MG tablet   losartan (COZAAR) 100 MG tablet   melatonin 5 MG TABS   metFORMIN (GLUCOPHAGE-XR) 500 MG 24 hr tablet   methylPREDNISolone (MEDROL) 4 MG tablet   metoprolol tartrate (LOPRESSOR) 50 MG tablet   No current facility-administered medications for this encounter.   Marcille Blanco [facility] Surgical Short Stay/Anesthesiology [facility] Phone [phone] 12/24/2022 10:41 AM

## 2022-12-25 ENCOUNTER — Ambulatory Visit (HOSPITAL_COMMUNITY): Payer: Medicare Other | Admitting: Certified Registered Nurse Anesthetist

## 2022-12-25 ENCOUNTER — Encounter (HOSPITAL_COMMUNITY): Payer: Self-pay | Admitting: General Surgery

## 2022-12-25 ENCOUNTER — Encounter (HOSPITAL_COMMUNITY)
Admission: RE | Disposition: A | Discharge status: Home / Self Care | Payer: Self-pay | Source: Home / Self Care | Attending: General Surgery

## 2022-12-25 ENCOUNTER — Ambulatory Visit (HOSPITAL_COMMUNITY): Payer: Medicare Other | Admitting: Medical

## 2022-12-25 ENCOUNTER — Other Ambulatory Visit: Payer: Self-pay

## 2022-12-25 ENCOUNTER — Ambulatory Visit (HOSPITAL_COMMUNITY)
Admission: RE | Admit: 2022-12-25 | Discharge: 2022-12-25 | Disposition: A | Discharge status: Home / Self Care | Payer: Medicare Other | Attending: General Surgery | Admitting: General Surgery

## 2022-12-25 DIAGNOSIS — R519 Headache, unspecified: Secondary | ICD-10-CM

## 2022-12-25 DIAGNOSIS — E119 Type 2 diabetes mellitus without complications: Secondary | ICD-10-CM | POA: Insufficient documentation

## 2022-12-25 DIAGNOSIS — K219 Gastro-esophageal reflux disease without esophagitis: Secondary | ICD-10-CM | POA: Diagnosis not present

## 2022-12-25 DIAGNOSIS — I447 Left bundle-branch block, unspecified: Secondary | ICD-10-CM | POA: Diagnosis not present

## 2022-12-25 HISTORY — PX: ARTERY BIOPSY: SHX891

## 2022-12-25 LAB — GLUCOSE, CAPILLARY
Glucose-Capillary: 140 mg/dL — ABNORMAL HIGH (ref 70–99)
Glucose-Capillary: 130 mg/dL — ABNORMAL HIGH (ref 70–99)
Glucose-Capillary: 106 mg/dL — ABNORMAL HIGH (ref 70–99)

## 2022-12-25 SURGERY — BIOPSY TEMPORAL ARTERY
Anesthesia: Monitor Anesthesia Care | Laterality: Right

## 2022-12-25 MED ORDER — CHLORHEXIDINE GLUCONATE 0.12 % MT SOLN
15.0000 mL | Freq: Once | OROMUCOSAL | Status: AC
Start: 2022-12-25 — End: 2022-12-25
  Administered 2022-12-25: 15 mL via OROMUCOSAL

## 2022-12-25 MED ORDER — DEXAMETHASONE SODIUM PHOSPHATE 10 MG/ML IJ SOLN
INTRAMUSCULAR | Status: DC | PRN
  Administered 2022-12-25: 10 mg via INTRAVENOUS

## 2022-12-25 MED ORDER — PROPOFOL 10 MG/ML IV BOLUS
INTRAVENOUS | Status: AC
  Filled 2022-12-25: qty 20

## 2022-12-25 MED ORDER — LACTATED RINGERS IV SOLN: INTRAVENOUS | Status: DC

## 2022-12-25 MED ORDER — BUPIVACAINE-EPINEPHRINE 0.25% -1:200000 IJ SOLN
INTRAMUSCULAR | Status: DC | PRN
  Administered 2022-12-25: 5 mL

## 2022-12-25 MED ORDER — EPHEDRINE 5 MG/ML INJ
INTRAVENOUS | Status: AC
  Filled 2022-12-25: qty 5

## 2022-12-25 MED ORDER — ONDANSETRON HCL 4 MG/2ML IJ SOLN
INTRAMUSCULAR | Status: AC
  Filled 2022-12-25: qty 2

## 2022-12-25 MED ORDER — AMISULPRIDE (ANTIEMETIC) 5 MG/2ML IV SOLN: 10.0000 mg | Freq: Once | INTRAVENOUS | Status: DC | PRN

## 2022-12-25 MED ORDER — ACETAMINOPHEN 10 MG/ML IV SOLN
INTRAVENOUS | Status: AC
  Filled 2022-12-25: qty 100

## 2022-12-25 MED ORDER — FENTANYL CITRATE PF 50 MCG/ML IJ SOSY: 25.0000 ug | PREFILLED_SYRINGE | INTRAMUSCULAR | Status: DC | PRN

## 2022-12-25 MED ORDER — EPHEDRINE SULFATE-NACL 50-0.9 MG/10ML-% IV SOSY
PREFILLED_SYRINGE | INTRAVENOUS | Status: DC | PRN
  Administered 2022-12-25: 5 mg via INTRAVENOUS

## 2022-12-25 MED ORDER — PHENYLEPHRINE HCL-NACL 20-0.9 MG/250ML-% IV SOLN
INTRAVENOUS | Status: AC
  Filled 2022-12-25: qty 250

## 2022-12-25 MED ORDER — CHLORHEXIDINE GLUCONATE CLOTH 2 % EX PADS: 6.0000 | MEDICATED_PAD | Freq: Once | CUTANEOUS | Status: DC

## 2022-12-25 MED ORDER — 0.9 % SODIUM CHLORIDE (POUR BTL) OPTIME
TOPICAL | Status: DC | PRN
  Administered 2022-12-25: 500 mL

## 2022-12-25 MED ORDER — PROPOFOL 10 MG/ML IV BOLUS
INTRAVENOUS | Status: DC | PRN
  Administered 2022-12-25: 20 mg via INTRAVENOUS
  Administered 2022-12-25: 30 mg via INTRAVENOUS

## 2022-12-25 MED ORDER — MIDAZOLAM HCL 2 MG/2ML IJ SOLN
INTRAMUSCULAR | Status: AC
  Filled 2022-12-25: qty 2

## 2022-12-25 MED ORDER — CEFAZOLIN SODIUM-DEXTROSE 2-4 GM/100ML-% IV SOLN
2.0000 g | INTRAVENOUS | Status: AC
  Administered 2022-12-25: 2 g via INTRAVENOUS
  Filled 2022-12-25: qty 100

## 2022-12-25 MED ORDER — FENTANYL CITRATE (PF) 100 MCG/2ML IJ SOLN
INTRAMUSCULAR | Status: AC
  Filled 2022-12-25: qty 2

## 2022-12-25 MED ORDER — DEXAMETHASONE SODIUM PHOSPHATE 10 MG/ML IJ SOLN
INTRAMUSCULAR | Status: AC
  Filled 2022-12-25: qty 1

## 2022-12-25 MED ORDER — ORAL CARE MOUTH RINSE: 15.0000 mL | Freq: Once | OROMUCOSAL | Status: AC

## 2022-12-25 MED ORDER — PROPOFOL 500 MG/50ML IV EMUL
INTRAVENOUS | Status: DC | PRN
  Administered 2022-12-25: 50 ug/kg/min via INTRAVENOUS

## 2022-12-25 MED ORDER — BUPIVACAINE-EPINEPHRINE 0.25% -1:200000 IJ SOLN
INTRAMUSCULAR | Status: AC
  Filled 2022-12-25: qty 1

## 2022-12-25 MED ORDER — MIDAZOLAM HCL 5 MG/5ML IJ SOLN
INTRAMUSCULAR | Status: DC | PRN
  Administered 2022-12-25: 2 mg via INTRAVENOUS

## 2022-12-25 MED ORDER — FENTANYL CITRATE (PF) 100 MCG/2ML IJ SOLN
INTRAMUSCULAR | Status: DC | PRN
  Administered 2022-12-25 (×2): 25 ug via INTRAVENOUS

## 2022-12-25 MED ORDER — ONDANSETRON HCL 4 MG/2ML IJ SOLN
INTRAMUSCULAR | Status: DC | PRN
  Administered 2022-12-25: 4 mg via INTRAVENOUS

## 2022-12-25 MED ORDER — ACETAMINOPHEN 10 MG/ML IV SOLN
INTRAVENOUS | Status: DC | PRN
  Administered 2022-12-25: 1000 mg via INTRAVENOUS

## 2022-12-25 MED ORDER — INSULIN ASPART 100 UNIT/ML IJ SOLN: 0.0000 [IU] | INTRAMUSCULAR | Status: DC | PRN

## 2022-12-25 MED ORDER — TRAMADOL HCL 50 MG PO TABS: 50.0000 mg | ORAL_TABLET | Freq: Three times a day (TID) | ORAL | 0 refills | Status: AC | PRN

## 2022-12-25 SURGICAL SUPPLY — 34 items
BAG COUNTER SPONGE SURGICOUNT (BAG) IMPLANT
BLADE SURG 15 STRL LF DISP TIS (BLADE) ×2 IMPLANT
CHLORAPREP W/TINT 26 (MISCELLANEOUS) ×2 IMPLANT
COVER PROBE U/S 5X48 (MISCELLANEOUS) IMPLANT
COVER SURGICAL LIGHT HANDLE (MISCELLANEOUS) ×2 IMPLANT
DERMABOND ADVANCED .7 DNX12 (GAUZE/BANDAGES/DRESSINGS) ×2 IMPLANT
DISSECTOR ROUND CHERRY 3/8 STR (MISCELLANEOUS) IMPLANT
DRAPE LAPAROTOMY T 98X78 PEDS (DRAPES) ×2 IMPLANT
DRSG TEGADERM 2-3/8X2-3/4 SM (GAUZE/BANDAGES/DRESSINGS) IMPLANT
ELECT COATED BLADE 2.86 ST (ELECTRODE) ×2 IMPLANT
ELECT REM PT RETURN 15FT ADLT (MISCELLANEOUS) ×2 IMPLANT
GAUZE 4X4 16PLY ~~LOC~~+RFID DBL (SPONGE) ×2 IMPLANT
GAUZE SPONGE 2X2 8PLY STRL LF (GAUZE/BANDAGES/DRESSINGS) IMPLANT
GAUZE SPONGE 4X4 12PLY STRL (GAUZE/BANDAGES/DRESSINGS) IMPLANT
GLOVE BIOGEL PI IND STRL 7.0 (GLOVE) ×2 IMPLANT
GLOVE SURG SS PI 7.0 STRL IVOR (GLOVE) ×2 IMPLANT
GOWN STRL REUS W/ TWL LRG LVL3 (GOWN DISPOSABLE) ×2 IMPLANT
GOWN STRL REUS W/ TWL XL LVL3 (GOWN DISPOSABLE) IMPLANT
KIT BASIN OR (CUSTOM PROCEDURE TRAY) ×2 IMPLANT
KIT TURNOVER KIT A (KITS) IMPLANT
NDL HYPO 22X1.5 SAFETY MO (MISCELLANEOUS) ×2 IMPLANT
NEEDLE HYPO 22X1.5 SAFETY MO (MISCELLANEOUS) ×1
PACK BASIC VI WITH GOWN DISP (CUSTOM PROCEDURE TRAY) ×2 IMPLANT
PENCIL SMOKE EVACUATOR (MISCELLANEOUS) ×2 IMPLANT
SUCTION TUBE FRAZIER 12FR DISP (SUCTIONS) IMPLANT
SUT MNCRL AB 4-0 PS2 18 (SUTURE) ×2 IMPLANT
SUT SILK 2-0 18XBRD TIE 12 (SUTURE) IMPLANT
SUT SILK 3-0 18XBRD TIE 12 (SUTURE) IMPLANT
SUT SILK 4-0 12X30IN (SUTURE) ×2 IMPLANT
SUT VIC AB 3-0 SH 27X BRD (SUTURE) ×2 IMPLANT
SUT VIC AB 3-0 SH 27XBRD (SUTURE) IMPLANT
SYR CONTROL 10ML LL (SYRINGE) ×2 IMPLANT
TOWEL OR 17X26 10 PK STRL BLUE (TOWEL DISPOSABLE) ×2 IMPLANT
TOWEL OR NON WOVEN STRL DISP B (DISPOSABLE) ×2 IMPLANT

## 2022-12-25 NOTE — Anesthesia Postprocedure Evaluation (Signed)
Anesthesia Post Note  Patient: Amanda Bentley  Procedure(s) Performed: BIOPSY TEMPORAL ARTERY (Right)     Patient location during evaluation: PACU Anesthesia Type: MAC Level of consciousness: awake and alert Pain management: pain level controlled Vital Signs Assessment: post-procedure vital signs reviewed and stable Respiratory status: spontaneous breathing, nonlabored ventilation, respiratory function stable and patient connected to nasal cannula oxygen Cardiovascular status: stable and blood pressure returned to baseline Postop Assessment: no apparent nausea or vomiting Anesthetic complications: no  No notable events documented.  Last Vitals:  Vitals:   12/25/22 0900 12/25/22 0915  BP: (!) 167/86 (!) 161/89  Pulse: (!) 56 (!) 57  Resp: 18 16  Temp:  36.5 C  SpO2: 93% 94%    Last Pain:  Vitals:   12/25/22 0915  TempSrc:   PainSc: 0-No pain                 Kennieth Rad

## 2022-12-25 NOTE — Anesthesia Procedure Notes (Signed)
Procedure Name: MAC Date/Time: 12/25/2022 7:46 AM  Performed by: Ludwig Lean, CRNAPre-anesthesia Checklist: Patient identified, Emergency Drugs available, Suction available and Patient being monitored Patient Re-evaluated:Patient Re-evaluated prior to induction Oxygen Delivery Method: Simple face mask Airway Equipment and Method: Oral airway Placement Confirmation: positive ETCO2 and breath sounds checked- equal and bilateral

## 2022-12-25 NOTE — H&P (Signed)
Chief Complaint: Temporal Artery Biopsy  History of Present Illness: Amanda Bentley is a 67 y.o. female who is seen today as an office consultation for evaluation of Temporal Artery Biopsy  She has been having several different complains of joint pains and headaches. She had some jaw claudication a few weeks ago that has resolved. She has been put on high dose steroids for concern of arteritis.  Review of Systems: A complete review of systems was obtained from the patient. I have reviewed this information and discussed as appropriate with the patient. See HPI as well for other ROS.  Review of Systems  Constitutional: Negative.  HENT: Negative.  Eyes: Negative.  Respiratory: Negative.  Cardiovascular: Negative.  Gastrointestinal: Negative.  Genitourinary: Negative.  Musculoskeletal: Negative.  Skin: Negative.  Neurological: Negative.  Endo/Heme/Allergies: Negative.  Psychiatric/Behavioral: Negative.    Medical History: Past Medical History:  Diagnosis Date  Anxiety  Arthritis  Diabetes mellitus without complication (CMS/HHS-HCC)  GERD (gastroesophageal reflux disease)   There is no problem list on file for this patient.  Past Surgical History:  Procedure Laterality Date  RIGHT CARPAL TUNNEL RELEASE 03/31/2013  Dr. Teressa Senter  LEFT CARPAL TUNNEL RELEASE 01/12/2014  Dr. Merlyn Lot    Allergies  Allergen Reactions  Iodinated Contrast Media Anaphylaxis   Current Outpatient Medications on File Prior to Visit  Medication Sig Dispense Refill  hydroCHLOROthiazide (HYDRODIURIL) 25 MG tablet Take 25 mg by mouth every morning  metFORMIN (GLUCOPHAGE-XR) 500 MG XR tablet Take by mouth  methylPREDNISolone (MEDROL) 4 MG tablet Take by mouth  metoprolol TARTrate (LOPRESSOR) 50 MG tablet Take 1 tablet by mouth 2 (two) times daily  ALPRAZolam (XANAX) 0.5 MG tablet Take 0.5 mg by mouth  cholecalciferol (VITAMIN D3) 1000 unit tablet Take 1,000 Units by mouth once daily  losartan (COZAAR)  100 MG tablet Take 100 mg by mouth once daily   No current facility-administered medications on file prior to visit.   Family History  Problem Relation Age of Onset  Stomach cancer Sister  Colon cancer Maternal Aunt    Social History   Tobacco Use  Smoking Status Never  Smokeless Tobacco Never    Social History   Socioeconomic History  Marital status: Widowed  Tobacco Use  Smoking status: Never  Smokeless tobacco: Never  Substance and Sexual Activity  Alcohol use: Never  Drug use: Never   Social Drivers of Health   Received from [employer]  Social Network   Objective:   Vitals:  12/06/22 1011  BP: (!) 158/90  Pulse: 85  Temp: 36.8 C (98.2 F)  SpO2: 98%  Weight: (!) 106.7 kg (235 lb 3.2 oz)  Height: 167.6 cm (5\' 6" )  PainSc: 0-No pain   Body mass index is 37.96 kg/m.  Physical Exam Constitutional:  Appearance: Normal appearance.  HENT:  Head: Normocephalic and atraumatic.  Pulmonary:  Effort: Pulmonary effort is normal.  Musculoskeletal:  General: Normal range of motion.  Cervical back: Normal range of motion.  Neurological:  General: No focal deficit present.  Mental Status: She is alert and oriented to person, place, and time. Mental status is at baseline.  Psychiatric:  Mood and Affect: Mood normal.  Behavior: Behavior normal.  Thought Content: Thought content normal.   Labs, Imaging and Diagnostic Testing: I reviewed notes by Casimer Lanius  Assessment and Plan:   Diagnoses and all orders for this visit:  Temporal arteritis (CMS/HHS-HCC)    The pathophysiology of temporal arteritis was discussed. Differential diagnosis is discussed. Recommendation has been made  for an excision of a segment of the temporal artery near the front of the ear as an excisional biopsy for diagnostic purposes. We plan to do it on the same side where the patient is having the symptoms. Risks of bleeding, infection, nerve injury and other risks were discussed  in detail. Questions were answered. The patient agrees to proceed

## 2022-12-25 NOTE — Transfer of Care (Signed)
Immediate Anesthesia Transfer of Care Note  Patient: Amanda Bentley  Procedure(s) Performed: Procedure(s): BIOPSY TEMPORAL ARTERY (N/A)  Patient Location: PACU  Anesthesia Type:MAC  Level of Consciousness: Patient easily awoken, comfortable, cooperative, following commands, responds to stimulation.   Airway & Oxygen Therapy: Patient spontaneously breathing, ventilating well, oxygen via simple oxygen mask.  Post-op Assessment: Report given to PACU RN, vital signs reviewed and stable, moving all extremities.   Post vital signs: Reviewed and stable.  Complications: No apparent anesthesia complications  Last Vitals:  Vitals Value Taken Time  BP 137/72 12/25/22 0840  Temp    Pulse 63 12/25/22 0841  Resp 17 12/25/22 0841  SpO2 98 % 12/25/22 0841  Vitals shown include unfiled device data.  Last Pain:  Vitals:   12/25/22 0550  TempSrc: Oral  PainSc: 0-No pain      Patients Stated Pain Goal: 5 (12/25/22 0550)  Complications: No notable events documented.

## 2022-12-25 NOTE — Op Note (Signed)
Preoperative diagnosis: headaches  Postoperative diagnosis: same   Procedure: excisional right temporal artery biopsy  Surgeon: Feliciana Rossetti, M.D.  Asst: none  Anesthesia: Monitored Local Anesthesia with Sedation  Indications for procedure: Amanda Bentley is a 67 y.o. year old female with symptoms of recurring headaches not improving with medications. Due to concern for temporal arteritis decision was made to proceed with excisional temporal artery biopsy.  Description of procedure: The patient was brought into the operative suite. Anesthesia was administered with Monitored Local Anesthesia with Sedation. WHO checklist was applied. The patient was then placed in supine position. The hair above the ear was clipped. The area was prepped and draped in the usual sterile fashion.  Next, doppler was used to identify the temporal artery. The area was infused with marcaine for anesthetic. A vertical incision was over the area. Once through the skin, blunt dissection was used to identify the artery. doppler was used to confirm the location. The artery was dissected proximally and distally for length. Next, 4-0 silk ties and clips were used to ligate the artery and the artery was divided at each end and sent to pathology. The area was irrigated and hemostasis was intact. Doppler was used to examine the area and one additional artery was identified and removed using silk ties to ligate the ends. doppler was then used to confirm removal of the artery and no other arterial structures in the area. The skin was then closed with 3-0 vicryl in the deep dermal level and 4-0 monocryl subcuticular running suture for the superficial layer. Dermabond was put in place for dressing. The patient was brought to pacu in stable condition.  Findings: 2 arteries in the temporal area that were removed for biopsy  Specimen: right temporal artery  Implant: none   Blood loss: 5 ml  Local anesthesia:  5 ml marcaine    Complications: none  Feliciana Rossetti, M.D. General, Bariatric, & Minimally Invasive Surgery [facility]

## 2022-12-26 ENCOUNTER — Encounter (HOSPITAL_COMMUNITY): Payer: Self-pay | Admitting: General Surgery

## 2022-12-26 LAB — SURGICAL PATHOLOGY

## 2023-01-02 DIAGNOSIS — E669 Obesity, unspecified: Secondary | ICD-10-CM | POA: Diagnosis not present

## 2023-01-02 DIAGNOSIS — Z6837 Body mass index (BMI) 37.0-37.9, adult: Secondary | ICD-10-CM | POA: Diagnosis not present

## 2023-01-03 DIAGNOSIS — H43393 Other vitreous opacities, bilateral: Secondary | ICD-10-CM | POA: Diagnosis not present

## 2023-01-10 DIAGNOSIS — R531 Weakness: Secondary | ICD-10-CM | POA: Diagnosis not present

## 2023-01-10 DIAGNOSIS — R0789 Other chest pain: Secondary | ICD-10-CM | POA: Diagnosis not present

## 2023-01-10 DIAGNOSIS — R000 Tachycardia, unspecified: Secondary | ICD-10-CM | POA: Diagnosis not present

## 2023-01-10 DIAGNOSIS — R0602 Shortness of breath: Secondary | ICD-10-CM | POA: Diagnosis not present

## 2023-01-14 ENCOUNTER — Encounter: Payer: Self-pay | Admitting: Cardiology

## 2023-01-14 ENCOUNTER — Ambulatory Visit: Payer: Medicare Other | Attending: Cardiology | Admitting: Cardiology

## 2023-01-14 VITALS — BP 150/84 | HR 88 | Resp 16 | Ht 66.0 in | Wt 235.0 lb

## 2023-01-14 DIAGNOSIS — R0602 Shortness of breath: Secondary | ICD-10-CM | POA: Diagnosis not present

## 2023-01-14 DIAGNOSIS — I447 Left bundle-branch block, unspecified: Secondary | ICD-10-CM

## 2023-01-14 DIAGNOSIS — I10 Essential (primary) hypertension: Secondary | ICD-10-CM | POA: Diagnosis not present

## 2023-01-14 DIAGNOSIS — E782 Mixed hyperlipidemia: Secondary | ICD-10-CM

## 2023-01-14 DIAGNOSIS — Z6837 Body mass index (BMI) 37.0-37.9, adult: Secondary | ICD-10-CM

## 2023-01-14 DIAGNOSIS — I471 Supraventricular tachycardia, unspecified: Secondary | ICD-10-CM | POA: Diagnosis not present

## 2023-01-14 DIAGNOSIS — E66812 Obesity, class 2: Secondary | ICD-10-CM

## 2023-01-14 MED ORDER — LOSARTAN POTASSIUM 50 MG PO TABS: 50.0000 mg | ORAL_TABLET | Freq: Every evening | ORAL | 3 refills | Status: DC

## 2023-01-14 NOTE — Progress Notes (Signed)
Cardiology Office Note:  .   Date:  01/14/2023  ID:  Amanda Bentley, DOB May 24, 1955, MRN [id number] PCP:  Merri Brunette, MD  Former Cardiology Providers: Hedwig Morton, MD  [facility] Providers Cardiologist:  Tessa Lerner, DO , [facility] (established care 07/27/2020) Electrophysiologist:  None  Click to update primary MD,subspecialty MD or APP then REFRESH:1}    History of Present Illness: .   Amanda Bentley is a 67 y.o. Caucasian female whose past medical history and cardiovascular risk factors includes: LBBB, hypertension, prediabetes, osteoarthritis, carpal tunnel syndrome, postmenopausal female, advanced age, obesity due to excess calories.   Patient was referred to practice for evaluation and management of dyspnea on exertion and palpitations.  She underwent a Zio patch which noted underlying rhythm to be sinus but had symptomatic episodes of PSVT.  However, the data may be skewed as she was recovering from COVID-19 infection during that time as well.  At the last office visit the shared decision was to transition her from Lopressor to diltiazem.  Echocardiogram in the past noted preserved LVEF.  Blood pressures were managed by PCP until she requested assistance.  During telephone encounter in November 2024 she was started on hydrochlorothiazide 25 mg p.o. daily.  Follow-up labs noted stable renal function and potassium levels.  Her blood pressures continue to be elevated and when she called back on 12/21/2022 blood pressure was 180/90 mmHg she was started on hydralazine 25 mg p.o. 3 times daily.  In the interim she is also been worked up for temporal arteritis at [facility].  Patient is on high doses of steroids for reasons mentioned above.  As result of being on  prednisone she chose to discontinue diltiazem, hydralazine, losartan.  She never transition from metoprolol to diltiazem as recommended at the last office visit.  Review of Systems: .   Review of Systems   Cardiovascular:  Negative for chest pain, claudication, dyspnea on exertion, irregular heartbeat, leg swelling, near-syncope, orthopnea, palpitations, paroxysmal nocturnal dyspnea and syncope.  Respiratory:  Positive for cough and shortness of breath (chronic - 3 years ago s/p covid).   Hematologic/Lymphatic: Negative for bleeding problem.  Musculoskeletal:  Negative for muscle cramps and myalgias.  Neurological:  Negative for dizziness and light-headedness.    Studies Reviewed:   EKG: EKG Interpretation Date/Time:  Monday January 14 2023 13:32:32 EST Ventricular Rate:  94 PR Interval:  146 QRS Duration:  150 QT Interval:  392 QTC Calculation: 490 R Axis:   -51  Text Interpretation: Normal sinus rhythm Left axis deviation Left ventricular hypertrophy with QRS widening and repolarization abnormality ( R in aVL , Cornell product ) When compared with ECG of 23-Oct-2022 13:59, No significant change was found Confirmed by Tessa Lerner [phone]) on 01/14/2023 1:53:24 PM  Echocardiogram: 09/27/2022: Study Quality: Poor - no subcostal images.  Normal LV systolic function with visual EF 55-60%. Left ventricle cavity is normal in size. Mild concentric hypertrophy of the left ventricle. Normal global wall motion. Presence of a septal bulge. Normal diastolic filling pattern, normal LAP.  Mild (Grade I) mitral regurgitation. Mild tricuspid regurgitation. Compared to 08/09/2021 Grade I diastolic dysfunction is now normal otherwise no significant change.   Coronary calcium score 10/11/2021: 1. No coronary atherosclerotic calcifications. The observed calcium score of 0 is at the zeroth percentile for subjects of the same age, gender, and race/ethnicity who are free of clinical cardiovascular disease and treated diabetes. 2.  Aortic Atherosclerosis (ICD10-I70.0).  Cardiac monitor: Cardiac monitor (Zio Patch):  09/27/2022 - 10/11/2022 Dominant rhythm sinus. Heart rate 54-222.  Bpm.  Avg HR 82  bpm. No atrial fibrillation detected during the monitoring period. No ventricular tachycardia, high grade AV block, pauses (3 seconds or longer). Total supraventricular ectopic burden <1%. Frequent episodes of PSVT noted during the monitoring. Total ventricular ectopic burden <1%. Patient triggered events: 8.   Underlying episodes demonstrated either sinus rhythm or sinus tachycardia with intermittent episodes of PSVT. Patient triggered event on 10/01/2022 at 4:32 PM illustrated sinus rhythm with 15 beat of PSVT, 4.8 seconds in duration, max HR of 222 bpm and average HR of 96 bpm    Risk Assessment/Calculations:   N/A   Labs:       Latest Ref Rng & Units 12/20/2022    2:52 PM 08/04/2021   10:43 AM 01/25/2019   12:00 AM  CBC  WBC 4.0 - 10.5 K/uL 15.9  11.0  10.3   Hemoglobin 12.0 - 15.0 g/dL 16.1  09.6  04.5   Hematocrit 36.0 - 46.0 % 44.0  45.6  44.7   Platelets 150 - 400 K/uL 253  314  289        Latest Ref Rng & Units 12/10/2022   12:15 PM 08/04/2021   10:43 AM 01/25/2019   12:00 AM  BMP  Glucose 70 - 99 mg/dL 84  409  811   BUN 8 - 27 mg/dL 21  11  11    Creatinine 0.57 - 1.00 mg/dL 9.14  7.82  9.56   BUN/Creat Ratio 12 - 28 23     Sodium 134 - 144 mmol/L 139  140  139   Potassium 3.5 - 5.2 mmol/L 3.5  3.6  3.5   Chloride 96 - 106 mmol/L 100  108  104   CO2 20 - 29 mmol/L 22  24  23    Calcium 8.7 - 10.3 mg/dL 9.4  9.1  9.2       Latest Ref Rng & Units 12/10/2022   12:15 PM 08/04/2021   10:43 AM 01/25/2019   12:00 AM  CMP  Glucose 70 - 99 mg/dL 84  213  086   BUN 8 - 27 mg/dL 21  11  11    Creatinine 0.57 - 1.00 mg/dL 5.78  4.69  6.29   Sodium 134 - 144 mmol/L 139  140  139   Potassium 3.5 - 5.2 mmol/L 3.5  3.6  3.5   Chloride 96 - 106 mmol/L 100  108  104   CO2 20 - 29 mmol/L 22  24  23    Calcium 8.7 - 10.3 mg/dL 9.4  9.1  9.2     No results found for: "CHOL", "HDL", "LDLCALC", "LDLDIRECT", "TRIG", "CHOLHDL" No results for input(s): "LIPOA" in the last 8760  hours. No components found for: "NTPROBNP" No results for input(s): "PROBNP" in the last 8760 hours. No results for input(s): "TSH" in the last 8760 hours.   Physical Exam:    Today's Vitals   01/14/23 1332 01/14/23 1409  BP: (!) 162/90 (!) 150/84  Pulse: 96 88  Resp: 16   SpO2: 94%   Weight: 235 lb (106.6 kg)   Height: 5\' 6"  (1.676 m)     Body mass index is 37.93 kg/m. Wt Readings from Last 3 Encounters:  01/14/23 235 lb (106.6 kg)  12/25/22 234 lb (106.1 kg)  12/20/22 234 lb (106.1 kg)    Physical Exam  Constitutional: No distress.  Age appropriate, hemodynamically stable.   Neck: No JVD present.  Cardiovascular: Normal rate, regular rhythm, S1 normal, S2 normal, intact distal pulses and normal pulses. Exam reveals no gallop, no S3 and no S4.  No murmur heard. Pulmonary/Chest: Effort normal. No stridor. She has no wheezes. She has no rales. She exhibits no tenderness.  Abdominal: Soft. Bowel sounds are normal. She exhibits no distension. There is no abdominal tenderness.  Musculoskeletal:        General: Edema (trace) present.     Cervical back: Neck supple.  Neurological: She is alert and oriented to person, place, and time. She has intact cranial nerves (2-12).  Skin: Skin is warm and moist.   Impression & Recommendation(s):  Impression:   ICD-10-CM   1. Shortness of breath  R06.02 EKG 12-Lead    2. PSVT (paroxysmal supraventricular tachycardia) (HCC)  I47.10     3. Benign hypertension  I10 losartan (COZAAR) 50 MG tablet    4. Mixed hyperlipidemia  E78.2     5. LBBB (left bundle branch block)  I44.7     6. Class 2 severe obesity due to excess calories with serious comorbidity and body mass index (BMI) of 37.0 to 37.9 in adult [facility])  V25.366    E66.01    Z68.37       Recommendation(s):  Shortness of breath Chronic with acute exacerbation. Currently endorses URI symptoms. Will defer management to primary team to rule out noncardiac causes.  PSVT  (paroxysmal supraventricular tachycardia) (HCC) During the workup of her palpitations she had a Zio patch which noted symptomatic episodes of PSVT while she was on metoprolol. At the last office visit the shared decision was to transition her/wean her from metoprolol and started her on diltiazem.  However disposition occurred.  She is currently on Lopressor 50 mg p.o. twice daily. Will continue for now and reevaluate therapy in the future.  Benign hypertension Office blood pressures initially elevated but improved. Home blood pressures are now better controlled. Discontinue hydralazine 25 mg p.o. 3 times daily. Continue hydrochlorothiazide 25 mg p.o. every morning. Continue Lopressor 50 mg p.o. twice daily. Will restart losartan 50 mg p.o. every afternoon. Monitor for now. Reemphasized importance of low-salt diet.  LBBB (left bundle branch block) Chronic and stable.  Class 2 severe obesity due to excess calories with serious comorbidity and body mass index (BMI) of 37.0 to 37.9 in adult [facility]) Body mass index is 37.93 kg/m. I reviewed with her importance of diet, regular physical activity/exercise, weight loss.   Patient is educated on the importance of increasing physical activity gradually as tolerated with a goal of moderate intensity exercise for 30 minutes a day 5 days a week.  Orders Placed:  Orders Placed This Encounter  Procedures   EKG 12-Lead   Final Medication List:    Meds ordered this encounter  Medications   losartan (COZAAR) 50 MG tablet    Sig: Take 1 tablet (50 mg total) by mouth every evening.    Dispense:  90 tablet    Refill:  3    Decrease to 50 mg    Medications Discontinued During This Encounter  Medication Reason   hydrALAZINE (APRESOLINE) 25 MG tablet Discontinued by provider   diltiazem (CARDIZEM CD) 180 MG 24 hr capsule Patient Preference   losartan (COZAAR) 100 MG tablet Reorder      Current Outpatient Medications:    ALPRAZolam (XANAX) 0.5  MG tablet, Take 0.5 mg by mouth 2 (two) times daily as needed for anxiety., Disp: , Rfl:    Cholecalciferol (VITAMIN D)  50 MCG (2000 UT) tablet, Take 2,000 Units by mouth daily., Disp: , Rfl:    glipiZIDE (GLUCOTROL XL) 5 MG 24 hr tablet, Take 5 mg by mouth daily., Disp: , Rfl:    hydrochlorothiazide (HYDRODIURIL) 25 MG tablet, Take 1 tablet (25 mg total) by mouth every morning., Disp: 90 tablet, Rfl: 3   melatonin 5 MG TABS, Take 5 mg by mouth at bedtime as needed (sleep)., Disp: , Rfl:    metFORMIN (GLUCOPHAGE-XR) 500 MG 24 hr tablet, Take by mouth daily with breakfast., Disp: , Rfl:    methylPREDNISolone (MEDROL) 4 MG tablet, Take 20-28 mg by mouth See admin instructions. Take 28 mg with breakfast and 20 mg with lunch, Disp: , Rfl:    metoprolol tartrate (LOPRESSOR) 50 MG tablet, Take 1 tablet (50 mg total) by mouth 2 (two) times daily., Disp: 180 tablet, Rfl: 3   PREDNISONE PO, Take 36 mg by mouth daily., Disp: , Rfl:    losartan (COZAAR) 50 MG tablet, Take 1 tablet (50 mg total) by mouth every evening., Disp: 90 tablet, Rfl: 3  Consent:      NA  Disposition:   Follow-up in 6 months sooner if needed.  Her questions and concerns were addressed to her satisfaction. She voices understanding of the recommendations provided during this encounter.    Signed, Tessa Lerner, DO, [facility]  [facility]  [address] [place], [place] [address] [phone] 01/14/2023 7:04 PM

## 2023-01-14 NOTE — Patient Instructions (Signed)
Medication Instructions:  Your physician has recommended you make the following change in your medication:  STOP hydralazine and diltiazem  DECREASE losartan to 50 mg every evening  *If you need a refill on your cardiac medications before your next appointment, please call your pharmacy*  Lab Work: None ordered today. If you have labs (blood work) drawn today and your tests are completely normal, you will receive your results only by: MyChart Message (if you have MyChart) OR A paper copy in the mail If you have any lab test that is abnormal or we need to change your treatment, we will call you to review the results.  Testing/Procedures: None ordered today.  Follow-Up: At [facility], you and your health needs are our priority.  As part of our continuing mission to provide you with exceptional heart care, we have created designated Provider Care Teams.  These Care Teams include your primary Cardiologist (physician) and Advanced Practice Providers (APPs -  Physician Assistants and Nurse Practitioners) who all work together to provide you with the care you need, when you need it.  We recommend signing up for the patient portal called "MyChart".  Sign up information is provided on this After Visit Summary.  MyChart is used to connect with patients for Virtual Visits (Telemedicine).  Patients are able to view lab/test results, encounter notes, upcoming appointments, etc.  Non-urgent messages can be sent to your provider as well.   To learn more about what you can do with MyChart, go to [email or web address].    Your next appointment:   6 month(s)  The format for your next appointment:   In Person  Provider:   Tessa Lerner, DO {

## 2023-01-17 DIAGNOSIS — R600 Localized edema: Secondary | ICD-10-CM | POA: Diagnosis not present

## 2023-01-17 DIAGNOSIS — R058 Other specified cough: Secondary | ICD-10-CM | POA: Diagnosis not present

## 2023-01-24 DIAGNOSIS — L03116 Cellulitis of left lower limb: Secondary | ICD-10-CM | POA: Diagnosis not present

## 2023-01-24 DIAGNOSIS — I10 Essential (primary) hypertension: Secondary | ICD-10-CM | POA: Diagnosis not present

## 2023-01-24 DIAGNOSIS — R058 Other specified cough: Secondary | ICD-10-CM | POA: Diagnosis not present

## 2023-01-24 DIAGNOSIS — R600 Localized edema: Secondary | ICD-10-CM | POA: Diagnosis not present

## 2023-01-30 ENCOUNTER — Encounter (HOSPITAL_COMMUNITY): Payer: Self-pay

## 2023-01-30 ENCOUNTER — Emergency Department (HOSPITAL_COMMUNITY)
Admission: EM | Admit: 2023-01-30 | Discharge: 2023-01-30 | Disposition: A | Discharge status: Home / Self Care | Payer: Medicare Other | Attending: Emergency Medicine | Admitting: Emergency Medicine

## 2023-01-30 ENCOUNTER — Other Ambulatory Visit: Payer: Self-pay

## 2023-01-30 DIAGNOSIS — Z79899 Other long term (current) drug therapy: Secondary | ICD-10-CM | POA: Insufficient documentation

## 2023-01-30 DIAGNOSIS — R600 Localized edema: Secondary | ICD-10-CM | POA: Insufficient documentation

## 2023-01-30 DIAGNOSIS — E876 Hypokalemia: Secondary | ICD-10-CM | POA: Diagnosis not present

## 2023-01-30 DIAGNOSIS — L03116 Cellulitis of left lower limb: Secondary | ICD-10-CM

## 2023-01-30 DIAGNOSIS — R42 Dizziness and giddiness: Secondary | ICD-10-CM | POA: Diagnosis not present

## 2023-01-30 DIAGNOSIS — R531 Weakness: Secondary | ICD-10-CM

## 2023-01-30 DIAGNOSIS — R000 Tachycardia, unspecified: Secondary | ICD-10-CM | POA: Diagnosis not present

## 2023-01-30 DIAGNOSIS — T887XXA Unspecified adverse effect of drug or medicament, initial encounter: Secondary | ICD-10-CM | POA: Diagnosis not present

## 2023-01-30 DIAGNOSIS — I10 Essential (primary) hypertension: Secondary | ICD-10-CM | POA: Diagnosis not present

## 2023-01-30 LAB — URINALYSIS, ROUTINE W REFLEX MICROSCOPIC
Bacteria, UA: NONE SEEN
Bilirubin Urine: NEGATIVE
Glucose, UA: NEGATIVE mg/dL
Hgb urine dipstick: NEGATIVE
Ketones, ur: NEGATIVE mg/dL
Nitrite: NEGATIVE
Protein, ur: NEGATIVE mg/dL
Specific Gravity, Urine: 1.017 (ref 1.005–1.030)
pH: 7 (ref 5.0–8.0)

## 2023-01-30 LAB — CBC
HCT: 38.6 % (ref 36.0–46.0)
Hemoglobin: 13.4 g/dL (ref 12.0–15.0)
MCH: 33.1 pg (ref 26.0–34.0)
MCHC: 34.7 g/dL (ref 30.0–36.0)
MCV: 95.3 fL (ref 80.0–100.0)
Platelets: 352 10*3/uL (ref 150–400)
RBC: 4.05 MIL/uL (ref 3.87–5.11)
RDW: 16.6 % — ABNORMAL HIGH (ref 11.5–15.5)
WBC: 19.3 10*3/uL — ABNORMAL HIGH (ref 4.0–10.5)
nRBC: 0.1 % (ref 0.0–0.2)

## 2023-01-30 LAB — CBG MONITORING, ED
Glucose-Capillary: 74 mg/dL (ref 70–99)
Glucose-Capillary: 184 mg/dL — ABNORMAL HIGH (ref 70–99)

## 2023-01-30 LAB — BASIC METABOLIC PANEL
Anion gap: 13 (ref 5–15)
BUN: 22 mg/dL (ref 8–23)
CO2: 21 mmol/L — ABNORMAL LOW (ref 22–32)
Calcium: 9 mg/dL (ref 8.9–10.3)
Chloride: 101 mmol/L (ref 98–111)
Creatinine, Ser: 0.8 mg/dL (ref 0.44–1.00)
GFR, Estimated: >60 mL/min (ref >60)
Glucose, Bld: 73 mg/dL (ref 70–99)
Potassium: 2.8 mmol/L — ABNORMAL LOW (ref 3.5–5.1)
Sodium: 135 mmol/L (ref 135–145)

## 2023-01-30 MED ORDER — CLINDAMYCIN HCL 300 MG PO CAPS: 300.0000 mg | ORAL_CAPSULE | Freq: Four times a day (QID) | ORAL | 0 refills | Status: AC

## 2023-01-30 MED ORDER — CLINDAMYCIN HCL 300 MG PO CAPS
300.0000 mg | ORAL_CAPSULE | Freq: Once | ORAL | Status: AC
  Administered 2023-01-30: 300 mg via ORAL
  Filled 2023-01-30: qty 1

## 2023-01-30 MED ORDER — POTASSIUM CHLORIDE CRYS ER 20 MEQ PO TBCR: 20.0000 meq | EXTENDED_RELEASE_TABLET | Freq: Every day | ORAL | 0 refills | Status: DC

## 2023-01-30 MED ORDER — POTASSIUM CHLORIDE CRYS ER 20 MEQ PO TBCR
40.0000 meq | EXTENDED_RELEASE_TABLET | Freq: Once | ORAL | Status: AC
  Administered 2023-01-30: 40 meq via ORAL
  Filled 2023-01-30: qty 2

## 2023-01-30 NOTE — ED Triage Notes (Signed)
 Patient is here for evaluation of weakness and dizziness that has been going on since November when she started taking prednisone. Reports taking prednisone for auto immune disorder. CBG 136 with EMS.

## 2023-01-30 NOTE — Discharge Instructions (Addendum)
 You came to the emergency department with generalized fatigue, leg swelling, and cellulitis. Your generalized fatigue and leg swelling are likely side effects of your prednisone medication and I expect they will improve with tapering of this dose. Please follow up with your rheumatologist about this.   You can continue taking Lasix for your leg swelling and use compression stockings and elevation of your legs to help reduce the swelling. We will send a daily dose of potassium to your pharmacy for you to take while you are taking Lasix.   For your cellulitis, we will send a different antibiotic, Clindamycin , to your pharmacy.

## 2023-01-30 NOTE — ED Provider Notes (Signed)
 [place] EMERGENCY DEPARTMENT AT [facility] Provider Note   CSN: [id number] Arrival date & time: 01/30/23  1520   History  Chief Complaint  Patient presents with   Weakness   Dizziness    Amanda Bentley is a 68 y.o. female with a PMH of Polymyalgia rheumatica on chronic prednisone, HTN, LBBB, and osteoarthritis who presents with generalized fatigue since November, new bilateral leg swelling and pain, and blurry vision.  She says that all her symptoms began when she started prednisone back in November after being diagnosed with polymyalgia rheumatica by her rheumatologist.  She states that she is taking 32 mg/day and feels she is having lots of side effects.  She went to her rheumatologist office today but was extremely weak, so they told her to come to the emergency department. Her rheumatologist is planning to taper her prednisone and possibly switch her to a different option. She says the leg swelling is new in the past 2 weeks and is worse in the left leg. She saw her primary care physician for this who started her on Lasix 20 mg, which then was increased to 40 mg since the swelling persists. Her PCP also treated her left leg cellulitis with a 7 day course of cefuroxime, which she says did not improve her symptoms.    Home Medications Prior to Admission medications   Medication Sig Start Date End Date Taking? Authorizing Provider  clindamycin  (CLEOCIN ) 300 MG capsule Take 1 capsule (300 mg total) by mouth 4 (four) times daily for 7 days. 01/30/23 02/06/23 Yes Dorthy Gavia, MD  potassium chloride  SA (KLOR-CON  M) 20 MEQ tablet Take 1 tablet (20 mEq total) by mouth daily for 10 days. 01/30/23 02/09/23 Yes Dorthy Gavia, MD  ALPRAZolam (XANAX) 0.5 MG tablet Take 0.5 mg by mouth 2 (two) times daily as needed for anxiety.    [provider]  Cholecalciferol (VITAMIN D) 50 MCG (2000 UT) tablet Take 2,000 Units by mouth daily.    [provider]  glipiZIDE  (GLUCOTROL XL) 5 MG 24 hr tablet Take 5 mg by mouth daily. 01/03/23   [provider]  hydrochlorothiazide  (HYDRODIURIL ) 25 MG tablet Take 1 tablet (25 mg total) by mouth every morning. 11/30/22 02/28/23  Tolia, Sunit, DO  losartan  (COZAAR ) 50 MG tablet Take 1 tablet (50 mg total) by mouth every evening. 01/14/23   Tolia, Sunit, DO  melatonin 5 MG TABS Take 5 mg by mouth at bedtime as needed (sleep).    [provider]  metFORMIN (GLUCOPHAGE-XR) 500 MG 24 hr tablet Take by mouth daily with breakfast. 08/16/22   [provider]  methylPREDNISolone  (MEDROL ) 4 MG tablet Take 20-28 mg by mouth See admin instructions. Take 28 mg with breakfast and 20 mg with lunch 11/27/22   [provider]  metoprolol  tartrate (LOPRESSOR ) 50 MG tablet Take 1 tablet (50 mg total) by mouth 2 (two) times daily. 11/30/22 02/28/23  Tolia, Sunit, DO  PREDNISONE PO Take 36 mg by mouth daily.    [provider]      Allergies    Benadryl  [diphenhydramine ] and Iohexol    Review of Systems   Review of Systems  Constitutional:  Positive for fatigue.  HENT: Negative.    Eyes:        Blurry vision bilaterally  Respiratory: Negative.    Cardiovascular:  Positive for leg swelling.  Gastrointestinal: Negative.   Endocrine: Negative.   Genitourinary: Negative.   Musculoskeletal: Negative.   Allergic/Immunologic: Negative.  Neurological:  Positive for weakness. Negative for speech difficulty and numbness.  Hematological: Negative.   Psychiatric/Behavioral: Negative.     Physical Exam Updated Vital Signs BP (!) 150/98   Pulse 100   Temp 98.6 F (37 C) (Oral)   Resp 18   Ht 5\' 6"  (1.676 m)   Wt 106.6 kg   SpO2 99%   BMI 37.93 kg/m  Physical Exam Cardiovascular:     Rate and Rhythm: Normal rate.  Pulmonary:     Effort: Pulmonary effort is normal.     Breath sounds: Normal breath sounds.  Abdominal:     General: Abdomen is flat.     Palpations: Abdomen is soft.   Musculoskeletal:        General: Tenderness present.     Right lower leg: Edema present.     Left lower leg: Edema present.     Comments: 2+ pitting edema bilaterally, greater on the left; tenderness to palpation in bilateral ankles, no calf pain  Skin:    Findings: Erythema present.     Comments: Left lower leg confluent erythema  Neurological:     General: No focal deficit present.     Mental Status: She is alert and oriented to person, place, and time.    ED Results / Procedures / Treatments   Labs (all labs ordered are listed, but only abnormal results are displayed) Labs Reviewed  BASIC METABOLIC PANEL - Abnormal; Notable for the following components:      Result Value   Potassium 2.8 (*)    CO2 21 (*)    All other components within normal limits  CBC - Abnormal; Notable for the following components:   WBC 19.3 (*)    RDW 16.6 (*)    All other components within normal limits  URINALYSIS, ROUTINE W REFLEX MICROSCOPIC - Abnormal; Notable for the following components:   Leukocytes,Ua SMALL (*)    All other components within normal limits  CBG MONITORING, ED - Abnormal; Notable for the following components:   Glucose-Capillary 184 (*)    All other components within normal limits  CBG MONITORING, ED    EKG EKG Interpretation Date/Time:  Wednesday January 30 2023 15:31:39 EST Ventricular Rate:  129 PR Interval:  100 QRS Duration:  146 QT Interval:  410 QTC Calculation: 601 R Axis:   -47  Text Interpretation: Sinus tachycardia new Left bundle branch block unchanged Confirmed by Almond Army ([id number]) on 01/30/2023 3:33:51 PM  Radiology No results found.  Medications Ordered in ED Medications  clindamycin  (CLEOCIN ) capsule 300 mg (has no administration in time range)  potassium chloride  SA (KLOR-CON  M) CR tablet 40 mEq (40 mEq Oral Given 01/30/23 2202)    ED Course/ Medical Decision Making/ A&P Clinical Course as of 01/30/23 2308  Wed Jan 30, 2023  2204  Stable 49 YOF with chief complaint of weakness and fatigue. Benign labs. Leg swelling BL 2+ edema. Left leg cellulitis.  Recent started on lasix K low being replaced. Already on ABX per PCP.  On ABX x5 days.   [CC]  2249 I evaluated at bedside. POCUS Soft tissue with cobblestoning consistent with cellulitis. Will broaden to clinda. Will see PCP tomorrow. Negative POCUS for DVT [CC]  2250 EMERGENCY DEPARTMENT US  EXTREMITY EXAM "Study:  Limited Duplex of lwft Lower Extremity Veins"  INDICATIONS: Leg swelling Visualization of  regions in transverse plane with full compression visualized.    PERFORMED BY: Myself IMAGES ARCHIVED?: Yes VIEWS USED: Saphenous-femoral junction, Proximal femoral vein,  and Popliteal vein INTERPRETATION: No DVT visualized      [CC]  2251 EMERGENCY DEPARTMENT US  SOFT TISSUE INTERPRETATION "Study: Limited Soft Tissue Ultrasound"  INDICATIONS: Pain and Soft tissue infection Multiple views of the body part were obtained in real-time with a multi-frequency linear probe  PERFORMED BY: Myself IMAGES ARCHIVED?: Yes SIDE:Left BODY PART:Lower extremity INTERPRETATION:  Cellulitis present    [CC]    Clinical Course User Index [CC] Onetha Bile, MD   {   Medical Decision Making Amount and/or Complexity of Data Reviewed Labs: ordered.  Risk Prescription drug management.  Amanda Bentley is a 68 year old female who presents with one month of generalized fatigue and two weeks of 2+ pitting edema bilaterally. I suspect these symptoms are related to her prednisone use given their onset coinciding with her starting this medication. She says her rheumatologist has instructed her to taper down this medication as a result, which I anticipate will improve her symptoms. For her swelling, her PCP started Lasix at 20 mg and recently increased it to 40 mg, which has likely contributed to her hypokalemia of 2.9. She is also using compression stockings and leg  elevation, which she can continue to do. We will also give her daily potassium to take while she is on Lasix.   She also has confluent erythematous skin changes in the distal left leg extending up to mid-shin, which her PCP thought to be cellulitis and treated her with a 7 day course of cefuroxime. An ultrasound was performed in the ED which demonstrated cellulitis, no evidence of DVT. Her CBC shows leukocytosis of 19, possibly secondary to her prednisone use and/or cellulitis. Since her cellulitis has not improved following her antibiotics, we will broaden to clindamycin . She is hemodynamically stable and afebrile, low concern for systemic infection. She is medically stable and can follow up outpatient with her PCP.   Final Clinical Impression(s) / ED Diagnoses Final diagnoses:  Generalized weakness  Medication side effect  Cellulitis of left leg   Rx / DC Orders ED Discharge Orders          Ordered    potassium chloride  SA (KLOR-CON  M) 20 MEQ tablet  Daily        01/30/23 2303    clindamycin  (CLEOCIN ) 300 MG capsule  4 times daily        01/30/23 2303              Dorthy Gavia, MD 01/30/23 2308    Onetha Bile, MD 01/30/23 2309

## 2023-02-01 ENCOUNTER — Telehealth: Payer: Self-pay | Admitting: Cardiology

## 2023-02-01 DIAGNOSIS — E876 Hypokalemia: Secondary | ICD-10-CM | POA: Diagnosis not present

## 2023-02-01 DIAGNOSIS — L03116 Cellulitis of left lower limb: Secondary | ICD-10-CM | POA: Diagnosis not present

## 2023-02-01 DIAGNOSIS — B353 Tinea pedis: Secondary | ICD-10-CM | POA: Diagnosis not present

## 2023-02-01 DIAGNOSIS — R600 Localized edema: Secondary | ICD-10-CM | POA: Diagnosis not present

## 2023-02-01 NOTE — Telephone Encounter (Signed)
Pt seen in ED 1/15 and wanted to further discuss fatigue, edema and medications. Has pending appt today at pcp office. Requesting call

## 2023-02-01 NOTE — Telephone Encounter (Signed)
Spoke with pt and stated that the current issues she was having would need to be addressed with her PCP and rheumatologist. Pt verbalized understanding and stated she had an appt with her PCP today. Pt asked if she needed to keep her 02/11/23 with [facility] and Dr. Odis Hollingshead verified it would be okay to cancel and wait until 6 month f/u appt that is already scheduled for June 2025.

## 2023-02-04 DIAGNOSIS — R5383 Other fatigue: Secondary | ICD-10-CM | POA: Diagnosis not present

## 2023-02-04 DIAGNOSIS — M353 Polymyalgia rheumatica: Secondary | ICD-10-CM | POA: Diagnosis not present

## 2023-02-04 DIAGNOSIS — I10 Essential (primary) hypertension: Secondary | ICD-10-CM | POA: Diagnosis not present

## 2023-02-04 DIAGNOSIS — M316 Other giant cell arteritis: Secondary | ICD-10-CM | POA: Diagnosis not present

## 2023-02-08 DIAGNOSIS — R238 Other skin changes: Secondary | ICD-10-CM | POA: Diagnosis not present

## 2023-02-08 DIAGNOSIS — R600 Localized edema: Secondary | ICD-10-CM | POA: Diagnosis not present

## 2023-02-08 DIAGNOSIS — E876 Hypokalemia: Secondary | ICD-10-CM | POA: Diagnosis not present

## 2023-02-11 ENCOUNTER — Ambulatory Visit: Payer: Medicare Other | Admitting: Nurse Practitioner

## 2023-02-18 DIAGNOSIS — E876 Hypokalemia: Secondary | ICD-10-CM | POA: Diagnosis not present

## 2023-02-19 ENCOUNTER — Ambulatory Visit: Payer: Medicare Other | Admitting: Neurology

## 2023-02-19 DIAGNOSIS — R5383 Other fatigue: Secondary | ICD-10-CM | POA: Diagnosis not present

## 2023-02-19 DIAGNOSIS — R7309 Other abnormal glucose: Secondary | ICD-10-CM | POA: Diagnosis not present

## 2023-02-25 DIAGNOSIS — R600 Localized edema: Secondary | ICD-10-CM | POA: Diagnosis not present

## 2023-02-28 DIAGNOSIS — R7301 Impaired fasting glucose: Secondary | ICD-10-CM | POA: Diagnosis not present

## 2023-02-28 DIAGNOSIS — R5383 Other fatigue: Secondary | ICD-10-CM | POA: Diagnosis not present

## 2023-03-05 ENCOUNTER — Encounter: Payer: Self-pay | Admitting: Neurology

## 2023-03-05 ENCOUNTER — Ambulatory Visit: Payer: Medicare Other | Admitting: Neurology

## 2023-03-05 VITALS — BP 150/88 | HR 77 | Resp 18 | Ht 66.0 in | Wt 245.0 lb

## 2023-03-05 DIAGNOSIS — R600 Localized edema: Secondary | ICD-10-CM | POA: Diagnosis not present

## 2023-03-05 DIAGNOSIS — R292 Abnormal reflex: Secondary | ICD-10-CM | POA: Diagnosis not present

## 2023-03-05 DIAGNOSIS — R29898 Other symptoms and signs involving the musculoskeletal system: Secondary | ICD-10-CM

## 2023-03-05 DIAGNOSIS — R202 Paresthesia of skin: Secondary | ICD-10-CM | POA: Diagnosis not present

## 2023-03-05 DIAGNOSIS — M47816 Spondylosis without myelopathy or radiculopathy, lumbar region: Secondary | ICD-10-CM

## 2023-03-05 DIAGNOSIS — L03116 Cellulitis of left lower limb: Secondary | ICD-10-CM | POA: Diagnosis not present

## 2023-03-05 DIAGNOSIS — M47812 Spondylosis without myelopathy or radiculopathy, cervical region: Secondary | ICD-10-CM

## 2023-03-05 DIAGNOSIS — R531 Weakness: Secondary | ICD-10-CM

## 2023-03-05 NOTE — Progress Notes (Unsigned)
Follow-up Visit   Date: 03/05/2023    Amanda Bentley MRN: [id number] DOB: [birth year]    Amanda Bentley is a 68 y.o. right-handed Caucasian female with GERD and hypertension returning to the clinic for follow-up of bilateral leg weakness.  The patient was accompanied to the clinic by self.   IMPRESSION/PLAN: Generalized weakness involving the legs> arms, worsened since tapering of corticosteroids, prescribed by rheumatology for PMR.  CK was checked by rheumatology and, per patient, has been normal. Prior NCS/EMG from 02/2022 was normal.  Imaging of the cervical and lumbar spine shows multilevel spondylosis with biforaminal stenosis, no canal stenosis. I am not sure what is causing her leg weakness, so will recheck NCS/EMG of the right side.  With her exam showing hyperreflexia at the patella, I will also check MRI thoracic spine wo contrast to evaluate for structural pathology.    Further recommendations pending results.  --------------------------------------------- History of present illness: She had COVID in August 2022 and reports having generalized fatigue since this time.  Her husband has Parkinson's disease and is in hospice.  She endorses feeling tired all the time, even after getting adequate rest within an hour of being awake, she wants to sleep again.  She has low energy and low mood.  She feels weakness throughout, especially in the legs. "Legs feel like they can collapse".  She walks unassisted and has not had any falls, but feels unsteady. NCS/EMG of the right arm and leg from February 2024 was normal.  She has low back pain for many years.  No numbness/tingling or shooting pain.    UPDATE 08/07/2022: She is here for follow-up visit.  Her husband passed away a few months ago.  She was unable to do PT because of cost.  She continues to have weakness in the legs which is worse with exertion, especially in the right leg.  She endorses low back pain.  She is normally very  active and has been unable to keep up with her normal activities, such as keeping up with her yard and home. MRI lumbar spine from May 2024 shows osteophyte complex at the right L3-4 and L4-5 with lateral recess stenosis, no severe canal stenosis.   UPDATE 12/05/2022:  She is here for follow-up visit.  She was diagnosed with polymyalgia rheumatica by Dr. Deanne Coffer and started on high dose steroids.  She reports having resolution of shoulder pain and improved generalized weakness.  However, she has noticed that her blood pressure and blood sugars are harder to control since being on prednisone. She denies radicular arm or leg pain.   UPDATE 03/05/2023:  She is here with new complaints of generalized weakness.  At her last visit, she was doing well after starting steroids for PMR.  However soon after, she began tapering corticosteroids due to side effects. Around the same time. She starting having worsening weakness in the legs.  She is having to use a lift chair and has become very sedentary at home because of her weakness. She has not had any falls.    Medications:  Current Outpatient Medications on File Prior to Visit  Medication Sig Dispense Refill   ALPRAZolam (XANAX) 0.5 MG tablet Take 0.5 mg by mouth 2 (two) times daily as needed for anxiety.     Cholecalciferol (VITAMIN D) 50 MCG (2000 UT) tablet Take 2,000 Units by mouth daily.     glipiZIDE (GLUCOTROL XL) 5 MG 24 hr tablet Take 5 mg by mouth daily.  hydrochlorothiazide (HYDRODIURIL) 25 MG tablet Take 1 tablet (25 mg total) by mouth every morning. 90 tablet 3   losartan (COZAAR) 50 MG tablet Take 1 tablet (50 mg total) by mouth every evening. 90 tablet 3   melatonin 5 MG TABS Take 5 mg by mouth at bedtime as needed (sleep).     methylPREDNISolone (MEDROL) 4 MG tablet Take 16 mg by mouth See admin instructions. Take 16mg      metoprolol tartrate (LOPRESSOR) 50 MG tablet Take 1 tablet (50 mg total) by mouth 2 (two) times daily. 180 tablet 3    potassium chloride SA (KLOR-CON M) 20 MEQ tablet Take 1 tablet (20 mEq total) by mouth daily for 10 days. 20 tablet 0   PREDNISONE PO Take by mouth daily.     metFORMIN (GLUCOPHAGE-XR) 500 MG 24 hr tablet Take by mouth daily with breakfast. (Patient not taking: Reported on 03/05/2023)     No current facility-administered medications on file prior to visit.    Allergies:  Allergies  Allergen Reactions   Benadryl [Diphenhydramine] Hives   Iohexol      Desc: pt had swelling from iv contrast yrs ago. pt also does not like to take prednisone for it's side effects. she experiences facial tingling and the benadryl exacerbates this. did fine w/ all premeds and contrast., Onset Date: [id number]     Vital Signs:  BP (!) 150/88   Pulse 77   Resp 18   Ht 5\' 6"  (1.676 m)   Wt 245 lb (111.1 kg)   SpO2 98%   BMI 39.54 kg/m    Neurological Exam: MENTAL STATUS including orientation to time, place, person, recent and remote memory, attention span and concentration, language, and fund of knowledge is normal.  Speech is not dysarthric.  CRANIAL NERVES:   Pupils equal round and reactive to light.  Normal conjugate, extra-ocular eye movements in all directions of gaze.  No ptosis.  Face is symmetric. Palate elevates symmetrically.  Tongue is midline.  MOTOR:  No atrophy, fasciculations or abnormal movements.  No pronator drift.   Upper Extremity:  Right  Left  Deltoid  5/5   5/5   Biceps  5/5   5/5   Triceps  5/5   5/5   Infraspinatus 5/5  5/5  Medial pectoralis 5/5  5/5  Wrist extensors  5/5   5/5   Wrist flexors  5/5   5/5   Finger extensors  5/5   5/5   Finger flexors  5/5   5/5   Dorsal interossei  5/5   5/5   Tone (Ashworth scale)  0  0   Lower Extremity:  Right  Left  Hip flexors  4/5   4/5   Hip extensors  5/5   5/5   Adductor 5/5  5/5  Abductor 5/5  5/5  Knee flexors  5/5   5/5   Knee extensors  5/5   5/5   Dorsiflexors  5/5   5/5   Plantarflexors  5/5   5/5   Tone (Ashworth  scale)  0  0   MSRs:  Reflexes are 2+/4 throughout except 3+/4 bilateral patella.  SENSORY:  Intact to vibration throughout.  COORDINATION/GAIT:  Normal finger-to- nose-finger.  Intact rapid alternating movements bilaterally.  She is unable to stand up from chair without using arms. Gait is mildly wide-based, slow, unassisted.    Data: NCS/EMG of the right arm and legs 02/23/2022: This is a normal study of the right upper and  lower extremities. In particular, there is no evidence of a diffuse myopathy, neuropathy, or cervical/lumbosacral radiculopathy.   MRI lumbar spine wo contrast 06/07/2022: 1. Multilevel lumbar spondylosis, progressed since 2008 and most significant at L4-5 where there is displacement of the traversing right L5 nerve root in the lateral recess and moderate right neural foraminal narrowing, worse from prior. 2. At L3-4, right eccentric disc-osteophyte complex and facet arthropathy results in displacement of the traversing right L4 nerve root in the lateral recess. 3. L5-S1 moderate bilateral facet arthropathy with periarticular edema, which can be a cause of back pain.    MRI cervical spine wo contrast 12/03/2022: C2-3: Minimal disc bulge. Mild bilateral facet arthropathy. Mild bilateral foraminal narrowing. No central canal stenosis.   C3-4: Mild disc bulge with a small central disc protrusion contacting ventral cervical spinal cord. Severe left facet arthropathy and mild right facet arthropathy. Bilateral uncovertebral degenerative changes. Severe bilateral foraminal stenosis. Mild central canal stenosis.   C4-5: Mild disc bulge. Severe left facet arthropathy. Bilateral uncovertebral degenerative changes. Mild right foraminal stenosis. Severe left foraminal stenosis.   C5-6: Broad-based disc osteophyte complex. Bilateral uncovertebral degenerative changes and mild bilateral facet arthropathy. Moderate right and severe left foraminal stenosis. Mild central  canal stenosis.   C6-7: Mild disc bulge. Mild bilateral facet arthropathy. Bilateral uncovertebral degenerative changes. Moderate-severe right foraminal stenosis. Moderate left foraminal stenosis. Small bilateral perineural cyst. No central canal stenosis.   C7-T1: Mild disc osteophyte complex. Bilateral uncovertebral degenerative changes. Severe bilateral foraminal stenosis. No central canal stenosis.   IMPRESSION: 1. Diffuse cervical spine spondylosis as described above. 2. No acute osseous injury of the cervical spine.     Thank you for allowing me to participate in patient's care.  If I can answer any additional questions, I would be pleased to do so.    Sincerely,    Darcus Edds K. Allena Katz, DO

## 2023-03-11 DIAGNOSIS — I10 Essential (primary) hypertension: Secondary | ICD-10-CM | POA: Diagnosis not present

## 2023-03-11 DIAGNOSIS — G4700 Insomnia, unspecified: Secondary | ICD-10-CM | POA: Diagnosis not present

## 2023-03-11 DIAGNOSIS — R600 Localized edema: Secondary | ICD-10-CM | POA: Diagnosis not present

## 2023-03-11 DIAGNOSIS — F419 Anxiety disorder, unspecified: Secondary | ICD-10-CM | POA: Diagnosis not present

## 2023-03-14 DIAGNOSIS — R600 Localized edema: Secondary | ICD-10-CM | POA: Diagnosis not present

## 2023-03-18 DIAGNOSIS — R600 Localized edema: Secondary | ICD-10-CM | POA: Diagnosis not present

## 2023-03-18 DIAGNOSIS — I890 Lymphedema, not elsewhere classified: Secondary | ICD-10-CM | POA: Diagnosis not present

## 2023-03-19 DIAGNOSIS — M316 Other giant cell arteritis: Secondary | ICD-10-CM | POA: Diagnosis not present

## 2023-03-19 DIAGNOSIS — I10 Essential (primary) hypertension: Secondary | ICD-10-CM | POA: Diagnosis not present

## 2023-03-19 DIAGNOSIS — M353 Polymyalgia rheumatica: Secondary | ICD-10-CM | POA: Diagnosis not present

## 2023-03-19 DIAGNOSIS — D72829 Elevated white blood cell count, unspecified: Secondary | ICD-10-CM | POA: Diagnosis not present

## 2023-03-21 DIAGNOSIS — R600 Localized edema: Secondary | ICD-10-CM | POA: Diagnosis not present

## 2023-03-21 DIAGNOSIS — I890 Lymphedema, not elsewhere classified: Secondary | ICD-10-CM | POA: Diagnosis not present

## 2023-03-22 DIAGNOSIS — I890 Lymphedema, not elsewhere classified: Secondary | ICD-10-CM | POA: Diagnosis not present

## 2023-03-25 DIAGNOSIS — I890 Lymphedema, not elsewhere classified: Secondary | ICD-10-CM | POA: Diagnosis not present

## 2023-03-25 DIAGNOSIS — R600 Localized edema: Secondary | ICD-10-CM | POA: Diagnosis not present

## 2023-03-27 DIAGNOSIS — R600 Localized edema: Secondary | ICD-10-CM | POA: Diagnosis not present

## 2023-03-27 DIAGNOSIS — I890 Lymphedema, not elsewhere classified: Secondary | ICD-10-CM | POA: Diagnosis not present

## 2023-03-28 DIAGNOSIS — M8589 Other specified disorders of bone density and structure, multiple sites: Secondary | ICD-10-CM | POA: Diagnosis not present

## 2023-03-28 DIAGNOSIS — M7989 Other specified soft tissue disorders: Secondary | ICD-10-CM | POA: Diagnosis not present

## 2023-03-28 DIAGNOSIS — R739 Hyperglycemia, unspecified: Secondary | ICD-10-CM | POA: Diagnosis not present

## 2023-04-04 DIAGNOSIS — I10 Essential (primary) hypertension: Secondary | ICD-10-CM | POA: Diagnosis not present

## 2023-04-04 DIAGNOSIS — F419 Anxiety disorder, unspecified: Secondary | ICD-10-CM | POA: Diagnosis not present

## 2023-04-04 DIAGNOSIS — K219 Gastro-esophageal reflux disease without esophagitis: Secondary | ICD-10-CM | POA: Diagnosis not present

## 2023-04-04 DIAGNOSIS — G4700 Insomnia, unspecified: Secondary | ICD-10-CM | POA: Diagnosis not present

## 2023-04-05 ENCOUNTER — Encounter: Payer: Medicare Other | Admitting: Neurology

## 2023-04-11 ENCOUNTER — Ambulatory Visit: Admitting: Cardiology

## 2023-04-11 DIAGNOSIS — R600 Localized edema: Secondary | ICD-10-CM | POA: Diagnosis not present

## 2023-04-11 DIAGNOSIS — I890 Lymphedema, not elsewhere classified: Secondary | ICD-10-CM | POA: Diagnosis not present

## 2023-04-18 DIAGNOSIS — I890 Lymphedema, not elsewhere classified: Secondary | ICD-10-CM | POA: Diagnosis not present

## 2023-04-24 DIAGNOSIS — E876 Hypokalemia: Secondary | ICD-10-CM | POA: Diagnosis not present

## 2023-04-25 DIAGNOSIS — I890 Lymphedema, not elsewhere classified: Secondary | ICD-10-CM | POA: Diagnosis not present

## 2023-05-02 DIAGNOSIS — I890 Lymphedema, not elsewhere classified: Secondary | ICD-10-CM | POA: Diagnosis not present

## 2023-05-08 ENCOUNTER — Ambulatory Visit

## 2023-05-08 ENCOUNTER — Ambulatory Visit: Attending: Cardiology | Admitting: Cardiology

## 2023-05-08 ENCOUNTER — Encounter: Payer: Self-pay | Admitting: Cardiology

## 2023-05-08 VITALS — BP 130/88 | HR 88 | Resp 16 | Ht 66.0 in | Wt 249.8 lb

## 2023-05-08 DIAGNOSIS — I10 Essential (primary) hypertension: Secondary | ICD-10-CM | POA: Diagnosis not present

## 2023-05-08 DIAGNOSIS — R002 Palpitations: Secondary | ICD-10-CM

## 2023-05-08 DIAGNOSIS — R0602 Shortness of breath: Secondary | ICD-10-CM

## 2023-05-08 DIAGNOSIS — I447 Left bundle-branch block, unspecified: Secondary | ICD-10-CM

## 2023-05-08 DIAGNOSIS — I471 Supraventricular tachycardia, unspecified: Secondary | ICD-10-CM

## 2023-05-08 DIAGNOSIS — E66813 Obesity, class 3: Secondary | ICD-10-CM

## 2023-05-08 DIAGNOSIS — Z6841 Body Mass Index (BMI) 40.0 and over, adult: Secondary | ICD-10-CM

## 2023-05-08 NOTE — Progress Notes (Signed)
 Cardiology Office Note:  .   Date:  05/08/2023  ID:  Amanda Bentley, DOB 18-Apr-1955, MRN [id number] PCP:  Faustina Hood, MD  Former Cardiology Providers: Carilyn Charles, MD  [facility] Providers Cardiologist:  Olinda Bertrand, DO , [facility] (established care 07/27/2020) Electrophysiologist:  None  Click to update primary MD,subspecialty MD or APP then REFRESH:1}    History of Present Illness: .   Amanda Bentley is a 68 y.o. Caucasian female whose past medical history and cardiovascular risk factors includes: LBBB, hypertension, prediabetes, osteoarthritis, carpal tunnel syndrome, postmenopausal female, advanced age, obesity due to excess calories.   Patient was referred to practice for evaluation and management of dyspnea on exertion and palpitations.  She underwent a Zio patch which noted underlying rhythm to be sinus but had symptomatic episodes of PSVT.  However, the data may be skewed as she was recovering from COVID-19 infection during that time as well.  In the past she was transitioned from Lopressor  to diltiazem .  Blood pressures were managed by PCP until she requested assistance.  During telephone encounter in November 2024 she was started on hydrochlorothiazide  25 mg p.o. daily.  Follow-up labs noted stable renal function and potassium levels.  Her blood pressures continue to be elevated and when she called back on 12/21/2022 blood pressure was 180/90 mmHg she was started on hydralazine  25 mg p.o. 3 times daily.  In the interim she is also been worked up for temporal arteritis at [facility]. She was placed on high doses of steroids. As result of being on  prednisone she chose to discontinue AV nodal agents, hydralazine , losartan .    She presents today for follow-up/sick visit for shortness of breath and palpitations.    Patient states that she been feeling more tired, fatigued, short of breath predominantly with up related activities, and lower extremity swelling.  Based  on her last office visit she has gained approximately 14 pounds over the last 4 months.  She denies anginal chest pain at rest or with effort related activities.  she recently was also seen in the ED in January 2025 for similar  symptoms.  She was restarted on Lopressor  50 mg p.o. twice daily, Klor-Con  8 mEq p.o. daily as well as torsemide 20 mg p.o. daily.  She still not taking losartan  or hydrochlorothiazide .  Patient states that when she wakes up in the morning at times she notices her heart rate to be as high as 135 bpm.  She does not have a way of evaluating of her underlying rhythm during these episodes.  Review of Systems: .   Review of Systems  Constitutional: Positive for weight gain (14# since December 2024).  Cardiovascular:  Positive for dyspnea on exertion, leg swelling and palpitations. Negative for chest pain, claudication, irregular heartbeat, near-syncope, orthopnea, paroxysmal nocturnal dyspnea and syncope.  Respiratory:  Positive for shortness of breath.   Hematologic/Lymphatic: Negative for bleeding problem.    Studies Reviewed:    Echocardiogram: 09/27/2022: Study Quality: Poor - no subcostal images.  Normal LV systolic function with visual EF 55-60%. Left ventricle cavity is normal in size. Mild concentric hypertrophy of the left ventricle. Normal global wall motion. Presence of a septal bulge. Normal diastolic filling pattern, normal LAP.  Mild (Grade I) mitral regurgitation. Mild tricuspid regurgitation. Compared to 08/09/2021 Grade I diastolic dysfunction is now normal otherwise no significant change.   Coronary calcium score 10/11/2021: 1. No coronary atherosclerotic calcifications. The observed calcium score of 0 is at the zeroth  percentile for subjects of the same age, gender, and race/ethnicity who are free of clinical cardiovascular disease and treated diabetes. 2.  Aortic Atherosclerosis (ICD10-I70.0).  Cardiac monitor: Cardiac monitor (Zio  Patch): 09/27/2022 - 10/11/2022 Dominant rhythm sinus. Heart rate 54-222.  Bpm.  Avg HR 82 bpm. No atrial fibrillation detected during the monitoring period. No ventricular tachycardia, high grade AV block, pauses (3 seconds or longer). Total supraventricular ectopic burden <1%. Frequent episodes of PSVT noted during the monitoring. Total ventricular ectopic burden <1%. Patient triggered events: 8.   Underlying episodes demonstrated either sinus rhythm or sinus tachycardia with intermittent episodes of PSVT. Patient triggered event on 10/01/2022 at 4:32 PM illustrated sinus rhythm with 15 beat of PSVT, 4.8 seconds in duration, max HR of 222 bpm and average HR of 96 bpm  Risk Assessment/Calculations:   N/A   Labs:       Latest Ref Rng & Units 01/30/2023    4:45 PM 12/20/2022    2:52 PM 08/04/2021   10:43 AM  CBC  WBC 4.0 - 10.5 K/uL 19.3  15.9  11.0   Hemoglobin 12.0 - 15.0 g/dL 78.2  95.6  21.3   Hematocrit 36.0 - 46.0 % 38.6  44.0  45.6   Platelets 150 - 400 K/uL 352  253  314        Latest Ref Rng & Units 01/30/2023    4:45 PM 12/10/2022   12:15 PM 08/04/2021   10:43 AM  BMP  Glucose 70 - 99 mg/dL 73  84  086   BUN 8 - 23 mg/dL 22  21  11    Creatinine 0.44 - 1.00 mg/dL 5.78  4.69  6.29   BUN/Creat Ratio 12 - 28  23    Sodium 135 - 145 mmol/L 135  139  140   Potassium 3.5 - 5.1 mmol/L 2.8  3.5  3.6   Chloride 98 - 111 mmol/L 101  100  108   CO2 22 - 32 mmol/L 21  22  24    Calcium 8.9 - 10.3 mg/dL 9.0  9.4  9.1       Latest Ref Rng & Units 01/30/2023    4:45 PM 12/10/2022   12:15 PM 08/04/2021   10:43 AM  CMP  Glucose 70 - 99 mg/dL 73  84  528   BUN 8 - 23 mg/dL 22  21  11    Creatinine 0.44 - 1.00 mg/dL 4.13  2.44  0.10   Sodium 135 - 145 mmol/L 135  139  140   Potassium 3.5 - 5.1 mmol/L 2.8  3.5  3.6   Chloride 98 - 111 mmol/L 101  100  108   CO2 22 - 32 mmol/L 21  22  24    Calcium 8.9 - 10.3 mg/dL 9.0  9.4  9.1     No results found for: "CHOL", "HDL", "LDLCALC",  "LDLDIRECT", "TRIG", "CHOLHDL" No results for input(s): "LIPOA" in the last 8760 hours. No components found for: "NTPROBNP" No results for input(s): "PROBNP" in the last 8760 hours. No results for input(s): "TSH" in the last 8760 hours.   Physical Exam:    Today's Vitals   05/08/23 1413  BP: 130/88  Pulse: 88  Resp: 16  SpO2: 94%  Weight: 249 lb 12.8 oz (113.3 kg)  Height: 5\' 6"  (1.676 m)    Body mass index is 40.32 kg/m. Wt Readings from Last 3 Encounters:  05/08/23 249 lb 12.8 oz (113.3 kg)  03/05/23 245 lb (111.1  kg)  01/30/23 235 lb 0.2 oz (106.6 kg)    Physical Exam  Constitutional: No distress.  Age appropriate, hemodynamically stable.   Neck: No JVD present.  Cardiovascular: Normal rate, regular rhythm, S1 normal, S2 normal, intact distal pulses and normal pulses. Exam reveals no gallop, no S3 and no S4.  No murmur heard. Pulmonary/Chest: Effort normal. No stridor. She has no wheezes. She has no rales. She exhibits no tenderness.  Abdominal: Soft. Bowel sounds are normal. She exhibits no distension. There is no abdominal tenderness.  Musculoskeletal:        General: Edema (+2 bilateral up to midshin) present.     Cervical back: Neck supple.     Comments: Wearing bilateral ACE wraps.   Neurological: She is alert and oriented to person, place, and time. She has intact cranial nerves (2-12).  Skin: Skin is warm and moist.   Impression & Recommendation(s):  Impression:   ICD-10-CM   1. Shortness of breath  R06.02 ECHOCARDIOGRAM COMPLETE    Basic metabolic panel with GFR    Pro b natriuretic peptide (BNP)    LONG TERM MONITOR (3-14 DAYS)    Pro b natriuretic peptide (BNP)    Basic metabolic panel with GFR    2. Palpitations  R00.2 LONG TERM MONITOR (3-14 DAYS)    3. PSVT (paroxysmal supraventricular tachycardia) (HCC)  I47.10     4. Benign hypertension  I10     5. LBBB (left bundle branch block)  I44.7     6. Class 3 severe obesity due to excess calories  with serious comorbidity and body mass index (BMI) of 40.0 to 44.9 in adult [facility])  Z61.096    E66.01    Z68.41      Recommendation(s):  Shortness of breath Chronic but progressive Ongoing since the last office visit.  More noticeable with effort related activities, endorses lower extremity swelling, but no orthopnea or PND. Her symptoms are suggestive of new onset of heart failure but could be secondary to chronic use of steroids leading to volume retention. Check NT proBNP and BMP, today. Echo will be ordered to evaluate for structural heart disease and left ventricular systolic function. Will continue the current dose of torsemide 20 mg p.o. daily along with potassium supplements as prescribed.  Based on the results of the echo and NT proBNP will facilitate further diuresis  Palpitations PSVT (paroxysmal supraventricular tachycardia) (HCC) More noticeable during awakening hours. Currently monitors and using her pulse ox. Denies near-syncope or syncopal events. Continue Lopressor  50 mg p.o. twice daily. 7-day Zio patch to evaluate for dysrhythmias  Benign hypertension Although blood pressures are acceptable. Currently on Lopressor  and torsemide. Patient has stopped taking losartan /hydrochlorothiazide  but willing to restart it based on labs and echo findings  LBBB (left bundle branch block) Chronic and stable  Class 3 severe obesity due to excess calories with serious comorbidity and body mass index (BMI) of 40.0 to 44.9 in adult [facility]) Has gained approximately 14 pounds in the last 4 months. Body mass index is 40.32 kg/m. I reviewed with her importance of diet, regular physical activity/exercise, weight loss.   Patient is educated on the importance of increasing physical activity gradually as tolerated with a goal of moderate intensity exercise for 30 minutes a day 5 days a week.  Orders Placed:  Orders Placed This Encounter  Procedures   Basic metabolic panel with GFR     Standing Status:   Future    Number of Occurrences:   1  Expected Date:   05/08/2023    Expiration Date:   05/07/2024   Pro b natriuretic peptide (BNP)    Standing Status:   Future    Number of Occurrences:   1    Expected Date:   05/08/2023    Expiration Date:   05/07/2024   LONG TERM MONITOR (3-14 DAYS)    Standing Status:   Future    Expected Date:   05/15/2023    Expiration Date:   05/07/2024    Where should this test be performed?:   [facility]    Does the patient have an implanted cardiac device?:   No    Prescribed days of wear:   7    Type of enrollment:   Home Enrollment    Vendor::   Zio   ECHOCARDIOGRAM COMPLETE    Standing Status:   Future    Expected Date:   05/15/2023    Expiration Date:   05/07/2024    Where should this test be performed:   [facility])    Does the patient weigh less than or greater than 250 lbs?:   Patient weighs less than 250 lbs             249 lbs    Perflutren DEFINITY (image enhancing agent) should be administered unless hypersensitivity or allergy exist:   Administer Perflutren    Reason for exam-Echo:   Other-Full Diagnosis List    Full ICD-10/Reason for Exam:   Shortness of breath [786.05.ICD-9-CM]   Final Medication List:    No orders of the defined types were placed in this encounter.   Medications Discontinued During This Encounter  Medication Reason   melatonin 5 MG TABS Patient Preference   metFORMIN (GLUCOPHAGE-XR) 500 MG 24 hr tablet Patient Preference   potassium chloride  SA (KLOR-CON  M) 20 MEQ tablet Patient Preference      Current Outpatient Medications:    ALPRAZolam (XANAX) 0.5 MG tablet, Take 0.5 mg by mouth 2 (two) times daily as needed for anxiety., Disp: , Rfl:    Cholecalciferol (VITAMIN D) 50 MCG (2000 UT) tablet, Take 2,000 Units by mouth daily., Disp: , Rfl:    glipiZIDE (GLUCOTROL XL) 5 MG 24 hr tablet, Take 5 mg by mouth daily., Disp: , Rfl:    methylPREDNISolone  (MEDROL ) 4 MG tablet, Take  16 mg by mouth See admin instructions. Take 16mg , Disp: , Rfl:    metoprolol  tartrate (LOPRESSOR ) 50 MG tablet, Take 1 tablet (50 mg total) by mouth 2 (two) times daily., Disp: 180 tablet, Rfl: 3   potassium chloride  (KLOR-CON ) 8 MEQ tablet, Take 8 mEq by mouth daily., Disp: , Rfl:    PREDNISONE PO, Take 8 mg by mouth daily., Disp: , Rfl:    torsemide (DEMADEX) 20 MG tablet, Take 20 mg by mouth daily., Disp: , Rfl:    hydrochlorothiazide  (HYDRODIURIL ) 25 MG tablet, Take 1 tablet (25 mg total) by mouth every morning. (Patient not taking: Reported on 05/08/2023), Disp: 90 tablet, Rfl: 3   losartan  (COZAAR ) 50 MG tablet, Take 1 tablet (50 mg total) by mouth every evening. (Patient not taking: Reported on 05/08/2023), Disp: 90 tablet, Rfl: 3  Consent:      NA  Disposition:   Follow-up with APP for reevaluation of volume status and up titration of medical therapy and review test results  Follow-up in 6 months sooner if needed.  Her questions and concerns were addressed to her satisfaction. She voices understanding of the  recommendations provided during this encounter.    Signed, Olinda Bertrand, DO, [facility]  [facility]  [address] [place], [place] [address] [phone] 05/08/2023 3:09 PM

## 2023-05-08 NOTE — Patient Instructions (Signed)
 Medication Instructions:  Your physician recommends that you continue on your current medications as directed. Please refer to the Current Medication list given to you today.  *If you need a refill on your cardiac medications before your next appointment, please call your pharmacy*  Lab Work: To be completed today: BMP and Pro-BNP  If you have labs (blood work) drawn today and your tests are completely normal, you will receive your results only by: MyChart Message (if you have MyChart) OR A paper copy in the mail If you have any lab test that is abnormal or we need to change your treatment, we will call you to review the results.  Testing/Procedures: Your physician has requested that you wear a Zio heart monitor for 7 days. This will be mailed to your home with instructions on how to apply the monitor and how to return it when finished. Please allow 2 weeks after returning the heart monitor before our office calls you with the results.   Your physician has requested that you have an echocardiogram. Echocardiography is a painless test that uses sound waves to create images of your heart. It provides your doctor with information about the size and shape of your heart and how well your heart's chambers and valves are working. This procedure takes approximately one hour. There are no restrictions for this procedure. Please do NOT wear cologne, perfume, aftershave, or lotions (deodorant is allowed). Please arrive 15 minutes prior to your appointment time.  Please note: We ask at that you not bring children with you during ultrasound (echo/ vascular) testing. Due to room size and safety concerns, children are not allowed in the ultrasound rooms during exams. Our front office staff cannot provide observation of children in our lobby area while testing is being conducted. An adult accompanying a patient to their appointment will only be allowed in the ultrasound room at the discretion of the ultrasound  technician under special circumstances. We apologize for any inconvenience.   Follow-Up: At [facility], you and your health needs are our priority.  As part of our continuing mission to provide you with exceptional heart care, we have created designated Provider Care Teams.  These Care Teams include your primary Cardiologist (physician) and Advanced Practice Providers (APPs -  Physician Assistants and Nurse Practitioners) who all work together to provide you with the care you need, when you need it.  We recommend signing up for the patient portal called "MyChart".  Sign up information is provided on this After Visit Summary.  MyChart is used to connect with patients for Virtual Visits (Telemedicine).  Patients are able to view lab/test results, encounter notes, upcoming appointments, etc.  Non-urgent messages can be sent to your provider as well.   To learn more about what you can do with MyChart, go to [email or web address].    Your next appointment:   6 week(s) with APP  6 months with Dr. Albert Huff  The format for your next appointment:   In Person  Other Instructions ZIO XT- Long Term Monitor Instructions     Your physician has requested you wear a ZIO patch monitor for 7 days.  This is a single patch monitor. [employer] supplies one patch monitor per enrollment. Additional  stickers are not available. Please do not apply patch if you will be having a Nuclear Stress Test,  Echocardiogram, Cardiac CT, MRI, or Chest Xray during the period you would be wearing the  monitor. The patch cannot be worn during these tests. You cannot remove  and re-apply the  ZIO XT patch monitor.  Your ZIO patch monitor will be mailed 3 day USPS to your address on file. It may take 3-5 days  to receive your monitor after you have been enrolled.  Once you have received your monitor, please review the enclosed instructions. Your monitor  has already been registered assigning a specific monitor serial # to you.      Billing and Patient Assistance Program Information     We have supplied [employer] with any of your insurance information on file for billing purposes.  [employer] offers a sliding scale Patient Assistance Program for patients that do not have  insurance, or whose insurance does not completely cover the cost of the ZIO monitor.  You must apply for the Patient Assistance Program to qualify for this discounted rate.  To apply, please call [employer] at [phone], select option 4, select option 2, ask to apply for  Patient Assistance Program. Sanna Crystal will ask your household income, and how many people  are in your household. They will quote your out-of-pocket cost based on that information.  [employer] will also be able to set up a 20-month, interest-free payment plan if needed.     Applying the monitor     Shave hair from upper left chest.  Hold abrader disc by orange tab. Rub abrader in 40 strokes over the upper left chest as  indicated in your monitor instructions.  Clean area with 4 enclosed alcohol pads. Let dry.  Apply patch as indicated in monitor instructions. Patch will be placed under collarbone on left  side of chest with arrow pointing upward.  Rub patch adhesive wings for 2 minutes. Remove white label marked "1". Remove the white  label marked "2". Rub patch adhesive wings for 2 additional minutes.  While looking in a mirror, press and release button in center of patch. A small green light will  flash 3-4 times. This will be your only indicator that the monitor has been turned on.  Do not shower for the first 24 hours. You may shower after the first 24 hours.  Press the button if you feel a symptom. You will hear a small click. Record Date, Time and  Symptom in the Patient Logbook.  When you are ready to remove the patch, follow instructions on the last 2 pages of Patient  Logbook. Stick patch monitor onto the last page of Patient Logbook.  Place Patient Logbook in the blue and white  box. Use locking tab on box and tape box closed  securely. The blue and white box has prepaid postage on it. Please place it in the mailbox as  soon as possible. Your physician should have your test results approximately 7 days after the  monitor has been mailed back to [facility].  Call [facility] Customer Care at [phone] if you have questions regarding  your ZIO XT patch monitor. Call them immediately if you see an orange light blinking on your  monitor.  If your monitor falls off in less than 4 days, contact our Monitor department at [phone].  If your monitor becomes loose or falls off after 4 days call [employer] at [phone] for  suggestions on securing your monitor.

## 2023-05-08 NOTE — Progress Notes (Unsigned)
 Enrolled patient for a 7 day Zio XT monitor to be mailed to patients home.

## 2023-05-09 DIAGNOSIS — R739 Hyperglycemia, unspecified: Secondary | ICD-10-CM | POA: Diagnosis not present

## 2023-05-09 DIAGNOSIS — N814 Uterovaginal prolapse, unspecified: Secondary | ICD-10-CM | POA: Diagnosis not present

## 2023-05-09 DIAGNOSIS — E538 Deficiency of other specified B group vitamins: Secondary | ICD-10-CM | POA: Diagnosis not present

## 2023-05-09 DIAGNOSIS — R350 Frequency of micturition: Secondary | ICD-10-CM | POA: Diagnosis not present

## 2023-05-09 LAB — BASIC METABOLIC PANEL WITH GFR
BUN/Creatinine Ratio: 20 (ref 12–28)
BUN: 17 mg/dL (ref 8–27)
CO2: 21 mmol/L (ref 20–29)
Calcium: 9.4 mg/dL (ref 8.7–10.3)
Chloride: 104 mmol/L (ref 96–106)
Creatinine, Ser: 0.86 mg/dL (ref 0.57–1.00)
Glucose: 96 mg/dL (ref 70–99)
Potassium: 3.7 mmol/L (ref 3.5–5.2)
Sodium: 142 mmol/L (ref 134–144)
eGFR: 74 mL/min/{1.73_m2} (ref >59)

## 2023-05-09 LAB — PRO B NATRIURETIC PEPTIDE: NT-Pro BNP: 376 pg/mL — ABNORMAL HIGH (ref 0–301)

## 2023-05-10 DIAGNOSIS — M316 Other giant cell arteritis: Secondary | ICD-10-CM | POA: Diagnosis not present

## 2023-05-10 DIAGNOSIS — E876 Hypokalemia: Secondary | ICD-10-CM | POA: Diagnosis not present

## 2023-05-10 DIAGNOSIS — Z79899 Other long term (current) drug therapy: Secondary | ICD-10-CM | POA: Diagnosis not present

## 2023-05-10 DIAGNOSIS — L659 Nonscarring hair loss, unspecified: Secondary | ICD-10-CM | POA: Diagnosis not present

## 2023-05-10 DIAGNOSIS — E559 Vitamin D deficiency, unspecified: Secondary | ICD-10-CM | POA: Diagnosis not present

## 2023-05-14 ENCOUNTER — Encounter: Payer: Self-pay | Admitting: Neurology

## 2023-05-16 DIAGNOSIS — E1165 Type 2 diabetes mellitus with hyperglycemia: Secondary | ICD-10-CM | POA: Diagnosis not present

## 2023-05-16 DIAGNOSIS — N951 Menopausal and female climacteric states: Secondary | ICD-10-CM | POA: Diagnosis not present

## 2023-05-16 DIAGNOSIS — I10 Essential (primary) hypertension: Secondary | ICD-10-CM | POA: Diagnosis not present

## 2023-05-17 DIAGNOSIS — E876 Hypokalemia: Secondary | ICD-10-CM | POA: Diagnosis not present

## 2023-05-17 DIAGNOSIS — R29898 Other symptoms and signs involving the musculoskeletal system: Secondary | ICD-10-CM | POA: Diagnosis not present

## 2023-05-20 ENCOUNTER — Encounter: Payer: Self-pay | Admitting: Cardiology

## 2023-05-20 ENCOUNTER — Telehealth: Payer: Self-pay | Admitting: Cardiology

## 2023-05-20 DIAGNOSIS — R5383 Other fatigue: Secondary | ICD-10-CM | POA: Diagnosis not present

## 2023-05-20 DIAGNOSIS — Z79899 Other long term (current) drug therapy: Secondary | ICD-10-CM | POA: Diagnosis not present

## 2023-05-20 DIAGNOSIS — M353 Polymyalgia rheumatica: Secondary | ICD-10-CM | POA: Diagnosis not present

## 2023-05-20 DIAGNOSIS — M316 Other giant cell arteritis: Secondary | ICD-10-CM | POA: Diagnosis not present

## 2023-05-20 NOTE — Telephone Encounter (Signed)
 LVM 3x to schedule f/u with an APP, will send reminder letter to patient

## 2023-05-20 NOTE — Telephone Encounter (Signed)
-----   Message from Alvah Auerbach sent at 05/08/2023  3:14 PM EDT ----- Regarding: Appt Please schedule pt a 6wk follow up with an APP per Dr Albert Huff.

## 2023-05-22 DIAGNOSIS — I890 Lymphedema, not elsewhere classified: Secondary | ICD-10-CM | POA: Diagnosis not present

## 2023-05-24 ENCOUNTER — Telehealth: Payer: Self-pay | Admitting: Cardiology

## 2023-05-24 NOTE — Telephone Encounter (Signed)
 Returned call to patient and answered all her questions. Patient will come in on 4/23 @2 :30 to have monitor applied

## 2023-05-24 NOTE — Telephone Encounter (Signed)
 Patient stated she has not put on her heart monitor as yet.  Patient wants a call back to discuss next steps.

## 2023-05-29 DIAGNOSIS — L659 Nonscarring hair loss, unspecified: Secondary | ICD-10-CM | POA: Diagnosis not present

## 2023-05-29 DIAGNOSIS — E876 Hypokalemia: Secondary | ICD-10-CM | POA: Diagnosis not present

## 2023-05-30 ENCOUNTER — Ambulatory Visit
Admission: RE | Admit: 2023-05-30 | Discharge: 2023-05-30 | Disposition: A | Discharge status: Home / Self Care | Source: Ambulatory Visit | Attending: Neurology | Admitting: Neurology

## 2023-05-30 DIAGNOSIS — M47816 Spondylosis without myelopathy or radiculopathy, lumbar region: Secondary | ICD-10-CM

## 2023-05-30 DIAGNOSIS — R292 Abnormal reflex: Secondary | ICD-10-CM

## 2023-05-30 DIAGNOSIS — R29898 Other symptoms and signs involving the musculoskeletal system: Secondary | ICD-10-CM

## 2023-05-30 DIAGNOSIS — M47814 Spondylosis without myelopathy or radiculopathy, thoracic region: Secondary | ICD-10-CM | POA: Diagnosis not present

## 2023-05-30 DIAGNOSIS — M47812 Spondylosis without myelopathy or radiculopathy, cervical region: Secondary | ICD-10-CM

## 2023-05-30 DIAGNOSIS — R531 Weakness: Secondary | ICD-10-CM

## 2023-05-30 DIAGNOSIS — R202 Paresthesia of skin: Secondary | ICD-10-CM

## 2023-05-30 DIAGNOSIS — M4804 Spinal stenosis, thoracic region: Secondary | ICD-10-CM | POA: Diagnosis not present

## 2023-05-31 ENCOUNTER — Ambulatory Visit: Attending: Cardiology

## 2023-06-05 DIAGNOSIS — L650 Telogen effluvium: Secondary | ICD-10-CM | POA: Diagnosis not present

## 2023-06-06 DIAGNOSIS — R29898 Other symptoms and signs involving the musculoskeletal system: Secondary | ICD-10-CM | POA: Diagnosis not present

## 2023-06-06 DIAGNOSIS — I890 Lymphedema, not elsewhere classified: Secondary | ICD-10-CM | POA: Diagnosis not present

## 2023-06-11 ENCOUNTER — Ambulatory Visit: Payer: Self-pay | Admitting: Cardiology

## 2023-06-11 ENCOUNTER — Ambulatory Visit (HOSPITAL_COMMUNITY)
Admission: RE | Admit: 2023-06-11 | Discharge: 2023-06-11 | Disposition: A | Discharge status: Home / Self Care | Source: Ambulatory Visit | Attending: Cardiology | Admitting: Cardiology

## 2023-06-11 DIAGNOSIS — R0602 Shortness of breath: Secondary | ICD-10-CM | POA: Diagnosis not present

## 2023-06-11 DIAGNOSIS — I471 Supraventricular tachycardia, unspecified: Secondary | ICD-10-CM

## 2023-06-11 DIAGNOSIS — R002 Palpitations: Secondary | ICD-10-CM

## 2023-06-11 LAB — ECHOCARDIOGRAM COMPLETE
Area-P 1/2: 3.42 cm2
S' Lateral: 3.4 cm

## 2023-06-13 DIAGNOSIS — R29898 Other symptoms and signs involving the musculoskeletal system: Secondary | ICD-10-CM | POA: Diagnosis not present

## 2023-06-13 DIAGNOSIS — I890 Lymphedema, not elsewhere classified: Secondary | ICD-10-CM | POA: Diagnosis not present

## 2023-06-13 DIAGNOSIS — R002 Palpitations: Secondary | ICD-10-CM | POA: Diagnosis not present

## 2023-06-13 DIAGNOSIS — R0602 Shortness of breath: Secondary | ICD-10-CM | POA: Diagnosis not present

## 2023-06-14 DIAGNOSIS — R0602 Shortness of breath: Secondary | ICD-10-CM

## 2023-06-14 DIAGNOSIS — R002 Palpitations: Secondary | ICD-10-CM

## 2023-06-17 ENCOUNTER — Ambulatory Visit: Payer: Medicare Other | Admitting: Cardiology

## 2023-06-20 NOTE — Telephone Encounter (Signed)
 Pt returning call, requesting cb

## 2023-06-22 DIAGNOSIS — I890 Lymphedema, not elsewhere classified: Secondary | ICD-10-CM | POA: Diagnosis not present

## 2023-06-24 DIAGNOSIS — L659 Nonscarring hair loss, unspecified: Secondary | ICD-10-CM | POA: Diagnosis not present

## 2023-06-24 DIAGNOSIS — E1165 Type 2 diabetes mellitus with hyperglycemia: Secondary | ICD-10-CM | POA: Diagnosis not present

## 2023-06-24 DIAGNOSIS — R5383 Other fatigue: Secondary | ICD-10-CM | POA: Diagnosis not present

## 2023-06-24 DIAGNOSIS — M353 Polymyalgia rheumatica: Secondary | ICD-10-CM | POA: Diagnosis not present

## 2023-06-24 DIAGNOSIS — I10 Essential (primary) hypertension: Secondary | ICD-10-CM | POA: Diagnosis not present

## 2023-06-27 NOTE — Progress Notes (Addendum)
 Cardiology Office Note    Date:  06/28/2023  ID:  Amanda Bentley, DOB 08/09/1955, MRN [id number] PCP:  Amanda Pellet, MD  Cardiologist:  Amanda Large, DO  Electrophysiologist:  None   Chief Complaint: swelling  History of Present Illness: .    Amanda Bentley is a 68 y.o. female with visit-pertinent history of chronic dyspnea, LBBB, PSVT, polymyalgia rheumatica, HTN, obesity (sleep study pending), pre-DM, OA, carpal tunnel syndrome, hypokalemia seen for follow-up. CAC 09/2021 was zero, with aortic atherosclerosis noted. Zio 09/2022 showed 40 runs of PSVT with rare ectopy otherwise. Per Dr Amanda Bentley notes, the patient was previously started on prednisone for temporal arteritis at [facility]. His note indicates as result of being on  prednisone she chose to discontinue AV nodal agents, hydralazine , losartan . Amanda Bentley reports that her temporal biopsy was since ruled negative but by the time she had it done she had already been on steroids for a period of time. She was felt to have PMR therefore continued on prednisone. She recalls that her fluid issues really started after the steroid therapy began. Unfortunately all this hit a few months before she lost her husband. She was on Lasix  for a period of time initially though had issues with hypokalemia requiring replacement. She also had an ED visit 01/2023 in which venous duplex was negative for DVT (embedded in ED notes). At follow-up with Dr. Large 04/2023, she was noting progressive DOE, weight gain and palpitations. 2D echo 05/2023 showed EF 55-60%, moderate LVH, G1DD, mild MR, dilated IVC. She was recommended to continue torsemide  20mg  daily with potassium 8meq daily. Repeat monitor showed 13% tachycardia burden of PSVT, range 41-218bpm, average HR 83bpm. Triggered events correlated with either sinus or sinus tach with brief PSVT. Dr. Large referred her to Dr. Waddell which is pending for 07/05/23.  She returns for follow-up today reporting worsening  edema, SOB, and continued gradual weight gain (~235 end of 2024, now 251lb). She now has a few tiny spots on her legs that are beginning to weep. She has not been taking torsemide  for the last week and a half. Not reliably taking potassium either. She had concerns about how to take this given her prior low potassium level, as well as having to hold this when she has appointments due to frequent urination. She was taking it sporadically before this. In retrospect she also relays initial onset of DOE about 3 years ago when she had Covid, just hasn't felt the same since. There are discussions of transitioning her off prednisone onto Actemra soon. Palpitations have not been as much of an issue recently. She has not had any chest pain or orthopnea. She also had a sleep study ordered by neurology that she had deferred due to everything else going on.   Labwork independently reviewed: 04/2023 K 3.7, Cr 0.86, pBNP 376 KPN 02/2023 Hgb 14.1 12/2022 Hgb 14.6, plt 253 KPN 07/2022 LDL 119, trig 138, HDL 44 7979 LDL 96  ROS: .    Please see the history of present illness.  All other systems are reviewed and otherwise negative.  Studies Reviewed: Amanda Bentley    EKG:  EKG is ordered today, personally reviewed, demonstrating:  EKG Interpretation Date/Time:  Friday June 28 2023 13:27:54 EDT Ventricular Rate:  72 PR Interval:  144 QRS Duration:  150 QT Interval:  434 QTC Calculation: 475 R Axis:   -55  Text Interpretation: Normal sinus rhythm Left axis deviation Left bundle branch block No significant change since last tracing  Confirmed by Amanda Bentley [phone]) on 06/28/2023 1:33:20 PM   CV Studies: Cardiac studies reviewed are outlined and summarized above. Otherwise please see EMR for full report.   Current Reported Medications:.    Current Meds  Medication Sig   ALPRAZolam (XANAX) 0.5 MG tablet Take 0.5 mg by mouth 2 (two) times daily as needed for anxiety.   Cholecalciferol (VITAMIN D) 50 MCG (2000 UT) tablet  Take 2,000 Units by mouth daily.   glipiZIDE (GLUCOTROL XL) 5 MG 24 hr tablet Take 5 mg by mouth daily.   Prednisone 4 MG tablet Take 8 mg by mouth daily.   metoprolol  tartrate (LOPRESSOR ) 50 MG tablet Take 1 tablet (50 mg total) by mouth 2 (two) times daily.   potassium chloride  (KLOR-CON ) 8 MEQ tablet Take 8 mEq by mouth daily.   PREDNISONE PO Take 8 mg by mouth daily.   torsemide  (DEMADEX ) 20 MG tablet Take 20 mg by mouth daily.    Physical Exam:    VS:  BP 130/80   Pulse 72   Ht 5' 6 (1.676 m)   Wt 251 lb (113.9 kg)   SpO2 98%   BMI 40.51 kg/m    Wt Readings from Last 3 Encounters:  06/28/23 251 lb (113.9 kg)  05/08/23 249 lb 12.8 oz (113.3 kg)  03/05/23 245 lb (111.1 kg)    GEN: Well nourished, well developed in no acute distress NECK: No JVD; No carotid bruits CARDIAC: RRR, no murmurs, rubs, gallops RESPIRATORY:  Clear to auscultation without rales, wheezing or rhonchi  ABDOMEN: Soft, non-tender, non-distended EXTREMITIES:  2+ BLE edema up to shins with two focal areas of scant weeping without cellulitic changes; No acute deformity   Asessement and Plan:.    1. Dyspnea on exertion, lower extremity edema, suspect multifactorial due to acute on chronic HFpEF as well as fluid retention from steroid therapy for PMR, query untreated OSA - recently she has only been taking torsemide  sporadically (off at least the last week) but it appears she needs more consistent dosing to help with diuresis. She also shares concerns regarding prior hypokalemia (2.8 in 01/2023, last checked 3.7 in 04/2023 with normal creatinine) therefore will resume back at the previous torsemide  20mg  dose to start. Will increase KCl to 40meq daily for now (prefers 4 10 mEq tablets) and add spironolactone  25mg  daily for both MRA/HF therapy as well as potassium sparing properties. ED precautions reviewed - we discussed that if she does not begin to diurese sometimes this can indicate a lack of absorption in the  stomach prompting the need to proceed to ER for IV diuresis. Will obtain CMET, CBC, TSH, Mg today with recheck BMET in 5 days to ensure K/Cr stable. Relayed 2g sodium restriction, 2L fluid restriction, daily weights with patient on AVS. Will move her 7/23 follow-up with me closer up. Consider SGLT2i and referral to pharmD for GLP1 at next OV. Addendum: since this original visit, management has been challenging by patient self-adjusting doses of medication including episodic interruptions in taking torsemide  and spironolactone  and intolerance of increased doses of potassium. This ultimately led to recommendation of proceeding to ED 6/27 given persistent edema and hypokalemia. We had moved OV up to 6/30 which she cancelled. She did go to ED 7/2. Per chart review they only gave 20mg  IV Lasix  x 1 then discharged home. BNP was now normal suggesting this is a multifactorial process. I have relayed concern about following up with rheumology re: prednisone contributing as well as PCP for  other causes (?could she have underlying Cushing's now with hypokalemia, pelvic pathology, etc). See phone note 7/10.  2. Left ventricular hypertrophy - I suspect this is related to HTN and possible untreated sleep apnea. No other stigmata of infiltrative disease on echo. Follow clinically. Encouraged her to complete sleep study ordered by neurology.  3. PSVT - no accelerating symptoms; continue metoprolol  50mg  BID. Will recheck lytes with TSH today. F/u Dr. Waddell as scheduled 07/05/23. Also encouraged completion of sleep study.  4. LBBB - chronic. Calcium score 2023 was zero. No new concerns.  5. Aortic atherosclerosis - will defer lipid management to primary care since otherwise coronaries had no calcification. Statin therapy should be considered for HLD if indicated.    Disposition: Have requested to move up 08/07/23 follow-up with me to 2-3 weeks instead. She will keep EP appointment as scheduled.  Signed, Tanda Morrissey N Kit Brubacher,  PA-C

## 2023-06-28 ENCOUNTER — Ambulatory Visit: Attending: Physician Assistant | Admitting: Physician Assistant

## 2023-06-28 ENCOUNTER — Telehealth: Payer: Self-pay | Admitting: Neurology

## 2023-06-28 ENCOUNTER — Encounter: Payer: Self-pay | Admitting: Physician Assistant

## 2023-06-28 VITALS — BP 130/80 | HR 72 | Ht 66.0 in | Wt 251.0 lb

## 2023-06-28 DIAGNOSIS — I471 Supraventricular tachycardia, unspecified: Secondary | ICD-10-CM

## 2023-06-28 DIAGNOSIS — I5033 Acute on chronic diastolic (congestive) heart failure: Secondary | ICD-10-CM

## 2023-06-28 DIAGNOSIS — I447 Left bundle-branch block, unspecified: Secondary | ICD-10-CM | POA: Diagnosis not present

## 2023-06-28 DIAGNOSIS — I517 Cardiomegaly: Secondary | ICD-10-CM | POA: Diagnosis not present

## 2023-06-28 DIAGNOSIS — R0609 Other forms of dyspnea: Secondary | ICD-10-CM

## 2023-06-28 DIAGNOSIS — I700 Atherosclerosis of aorta: Secondary | ICD-10-CM

## 2023-06-28 MED ORDER — SPIRONOLACTONE 25 MG PO TABS: 25.0000 mg | ORAL_TABLET | Freq: Every day | ORAL | 3 refills | Status: DC

## 2023-06-28 MED ORDER — POTASSIUM CHLORIDE ER 10 MEQ PO TBCR: 40.0000 meq | EXTENDED_RELEASE_TABLET | Freq: Every day | ORAL | 3 refills | Status: DC

## 2023-06-28 NOTE — Patient Instructions (Signed)
 Medication Instructions:  Your physician has recommended you make the following change in your medication:   1) START torsemide 20 mg daily 2) START spironolactone 25 mg daily 3)  INCREASE potassium chloride  to 40 meq daily (take 4 tablets)  *If you need a refill on your cardiac medications before your next appointment, please call your pharmacy*  Lab Work: TODAY: CMET, Magnesium, CBC, TSH Next week on Wednesday or Thursday: BMET If you have labs (blood work) drawn today and your tests are completely normal, you will receive your results only by: MyChart Message (if you have MyChart) OR A paper copy in the mail If you have any lab test that is abnormal or we need to change your treatment, we will call you to review the results.  Testing/Procedures: Your provider has requested you have a chest x-ray performed. You may have this done at [facility], you do not need an appointment. Their address is: [address] [place], [place], [place] [address] Phone: [phone]  Follow-Up: At [facility], you and your health needs are our priority.  As part of our continuing mission to provide you with exceptional heart care, our providers are all part of one team.  This team includes your primary Cardiologist (physician) and Advanced Practice Providers or APPs (Physician Assistants and Nurse Practitioners) who all work together to provide you with the care you need, when you need it.  Your next appointment:   07/05/23 with Dr. Carolynne Citron  3 weeks with Dayna Dunn, PA-C  Provider:   Dayna Dunn, PA-C  We recommend signing up for the patient portal called MyChart.  Sign up information is provided on this After Visit Summary.  MyChart is used to connect with patients for Virtual Visits (Telemedicine).  Patients are able to view lab/test results, encounter notes, upcoming appointments, etc.  Non-urgent messages can be sent to your provider as well.   To learn more about what you can do with  MyChart, go to [email or web address].   Other Instructions For patients with congestive heart failure, we give them these special instructions:  1. Follow a low-salt diet - you should be eating no more than 2,000mg  of sodium per day. This does not necessarily just apply to the salt you put on top of prepared food, but the sodium already in food. Processed food, frozen meals, canned goods, deli meat, and bread can have a surprising amount of sodium per serving so be sure to track this daily. 2. Watch your fluid intake. In general, you should not be taking in more than 2 liters of fluid per day (close to 64 oz of fluid per day). This includes sources of water in foods like soup, coffee, tea, milk, etc. It's important to stay hydrated but NOT to excess. 2. Weigh yourself on the same scale at same time of day and keep a log. 3. Call your doctor: (Anytime you feel any of the following symptoms)  - 3lb weight gain overnight or 5lb within a few days - Shortness of breath, with or without a dry hacking cough  - Swelling in the hands, feet or stomach  - If you have to sleep on extra pillows at night in order to breathe   IT IS IMPORTANT TO LET YOUR DOCTOR KNOW EARLY ON IF YOU ARE HAVING SYMPTOMS SO WE CAN HELP YOU!

## 2023-06-28 NOTE — Telephone Encounter (Signed)
 Called and left detailed message and let her know results are not back at this time but we will call as soon as we receive them.

## 2023-06-28 NOTE — Telephone Encounter (Signed)
 Pt calling for results

## 2023-06-29 LAB — COMPREHENSIVE METABOLIC PANEL WITH GFR
ALT: 41 IU/L — ABNORMAL HIGH (ref 0–32)
AST: 20 IU/L (ref 0–40)
Albumin: 4 g/dL (ref 3.9–4.9)
Alkaline Phosphatase: 58 IU/L (ref 44–121)
BUN/Creatinine Ratio: 18 (ref 12–28)
BUN: 15 mg/dL (ref 8–27)
Bilirubin Total: 0.6 mg/dL (ref 0.0–1.2)
CO2: 20 mmol/L (ref 20–29)
Calcium: 9.5 mg/dL (ref 8.7–10.3)
Chloride: 105 mmol/L (ref 96–106)
Creatinine, Ser: 0.85 mg/dL (ref 0.57–1.00)
Globulin, Total: 2.5 g/dL (ref 1.5–4.5)
Glucose: 108 mg/dL — ABNORMAL HIGH (ref 70–99)
Potassium: 3.4 mmol/L — ABNORMAL LOW (ref 3.5–5.2)
Sodium: 144 mmol/L (ref 134–144)
Total Protein: 6.5 g/dL (ref 6.0–8.5)
eGFR: 75 mL/min/{1.73_m2} (ref >59)

## 2023-06-29 LAB — CBC
Hematocrit: 42.6 % (ref 34.0–46.6)
Hemoglobin: 14.3 g/dL (ref 11.1–15.9)
MCH: 33.4 pg — ABNORMAL HIGH (ref 26.6–33.0)
MCHC: 33.6 g/dL (ref 31.5–35.7)
MCV: 100 fL — ABNORMAL HIGH (ref 79–97)
Platelets: 282 10*3/uL (ref 150–450)
RBC: 4.28 x10E6/uL (ref 3.77–5.28)
RDW: 12.6 % (ref 11.7–15.4)
WBC: 11.1 10*3/uL — ABNORMAL HIGH (ref 3.4–10.8)

## 2023-06-29 LAB — MAGNESIUM: Magnesium: 1.9 mg/dL (ref 1.6–2.3)

## 2023-06-29 LAB — TSH: TSH: 2.98 u[IU]/mL (ref 0.450–4.500)

## 2023-07-01 ENCOUNTER — Ambulatory Visit: Payer: Self-pay | Admitting: Physician Assistant

## 2023-07-01 ENCOUNTER — Other Ambulatory Visit: Payer: Self-pay | Admitting: Physician Assistant

## 2023-07-01 DIAGNOSIS — R5383 Other fatigue: Secondary | ICD-10-CM | POA: Diagnosis not present

## 2023-07-01 DIAGNOSIS — Z79899 Other long term (current) drug therapy: Secondary | ICD-10-CM | POA: Diagnosis not present

## 2023-07-01 DIAGNOSIS — M353 Polymyalgia rheumatica: Secondary | ICD-10-CM | POA: Diagnosis not present

## 2023-07-01 DIAGNOSIS — M316 Other giant cell arteritis: Secondary | ICD-10-CM | POA: Diagnosis not present

## 2023-07-01 NOTE — Progress Notes (Signed)
 BMET order already entered on Friday, asked Triage to ensure ordered correctly.

## 2023-07-03 ENCOUNTER — Ambulatory Visit
Admission: RE | Admit: 2023-07-03 | Discharge: 2023-07-03 | Disposition: A | Discharge status: Home / Self Care | Source: Ambulatory Visit | Attending: Physician Assistant | Admitting: Physician Assistant

## 2023-07-03 DIAGNOSIS — I5033 Acute on chronic diastolic (congestive) heart failure: Secondary | ICD-10-CM

## 2023-07-03 DIAGNOSIS — R0602 Shortness of breath: Secondary | ICD-10-CM | POA: Diagnosis not present

## 2023-07-04 ENCOUNTER — Telehealth: Payer: Self-pay | Admitting: Pharmacist

## 2023-07-04 DIAGNOSIS — R609 Edema, unspecified: Secondary | ICD-10-CM | POA: Diagnosis not present

## 2023-07-04 DIAGNOSIS — E669 Obesity, unspecified: Secondary | ICD-10-CM | POA: Diagnosis not present

## 2023-07-04 DIAGNOSIS — E7800 Pure hypercholesterolemia, unspecified: Secondary | ICD-10-CM | POA: Diagnosis not present

## 2023-07-04 DIAGNOSIS — I10 Essential (primary) hypertension: Secondary | ICD-10-CM | POA: Diagnosis not present

## 2023-07-04 DIAGNOSIS — I5033 Acute on chronic diastolic (congestive) heart failure: Secondary | ICD-10-CM | POA: Diagnosis not present

## 2023-07-04 NOTE — Progress Notes (Signed)
   07/04/2023  Patient ID: Amanda Bentley, female   DOB: 1955/02/11, 68 y.o.   MRN: [id number]  Patient appeared on insurance report for at-risk for failing 2025 metric: Medication Adherence for Diabetes (MAD)   Outreach to the patient was Successful.   Medication: Glipizide 5mg  Last fill date: 05/28/23 30DS  Fail date of 06/04/23  Called and spoke with the patient on the phone. She reports no longer taking Glipizide at this time and has an upcoming appointment with Dr. Walter Gunning again soon. They are working on changing some things around.  Due to this, patient fails 2025 adherence metric for glipizide.    Delvin File, PharmD [facility]  Phone Number: [phone]

## 2023-07-05 ENCOUNTER — Encounter: Payer: Self-pay | Admitting: Internal Medicine

## 2023-07-05 ENCOUNTER — Other Ambulatory Visit: Payer: Self-pay | Admitting: *Deleted

## 2023-07-05 ENCOUNTER — Ambulatory Visit: Attending: Internal Medicine | Admitting: Internal Medicine

## 2023-07-05 ENCOUNTER — Ambulatory Visit: Payer: Self-pay | Admitting: Neurology

## 2023-07-05 ENCOUNTER — Telehealth: Payer: Self-pay | Admitting: Physician Assistant

## 2023-07-05 VITALS — BP 140/80 | HR 78 | Ht 66.0 in | Wt 249.0 lb

## 2023-07-05 DIAGNOSIS — I5033 Acute on chronic diastolic (congestive) heart failure: Secondary | ICD-10-CM

## 2023-07-05 DIAGNOSIS — R0609 Other forms of dyspnea: Secondary | ICD-10-CM | POA: Diagnosis not present

## 2023-07-05 DIAGNOSIS — Z79899 Other long term (current) drug therapy: Secondary | ICD-10-CM

## 2023-07-05 DIAGNOSIS — I10 Essential (primary) hypertension: Secondary | ICD-10-CM

## 2023-07-05 DIAGNOSIS — I517 Cardiomegaly: Secondary | ICD-10-CM

## 2023-07-05 DIAGNOSIS — E782 Mixed hyperlipidemia: Secondary | ICD-10-CM | POA: Diagnosis not present

## 2023-07-05 LAB — BASIC METABOLIC PANEL WITH GFR
BUN/Creatinine Ratio: 22 (ref 12–28)
BUN: 21 mg/dL (ref 8–27)
CO2: 19 mmol/L — ABNORMAL LOW (ref 20–29)
Calcium: 9.3 mg/dL (ref 8.7–10.3)
Chloride: 102 mmol/L (ref 96–106)
Creatinine, Ser: 0.97 mg/dL (ref 0.57–1.00)
Glucose: 136 mg/dL — ABNORMAL HIGH (ref 70–99)
Potassium: 3 mmol/L — ABNORMAL LOW (ref 3.5–5.2)
Sodium: 143 mmol/L (ref 134–144)
eGFR: 64 mL/min/{1.73_m2} (ref >59)

## 2023-07-05 MED ORDER — TORSEMIDE 20 MG PO TABS: 20.0000 mg | ORAL_TABLET | ORAL | 1 refills | Status: DC

## 2023-07-05 MED ORDER — TORSEMIDE 20 MG PO TABS: 20.0000 mg | ORAL_TABLET | ORAL | Status: DC

## 2023-07-05 MED ORDER — POTASSIUM CHLORIDE CRYS ER 20 MEQ PO TBCR: 40.0000 meq | EXTENDED_RELEASE_TABLET | Freq: Two times a day (BID) | ORAL | 1 refills | Status: AC

## 2023-07-05 NOTE — Telephone Encounter (Signed)
 Spoke with pt face to face.  She is aware to change Torsemide 20 mg to every other day Take Potassium 20 meq, taking 2 tablets twice a day Take Spironolactone 25 daily (no changes to this)  Pt will come in 07/10/23 for BMET. Order in and has been released.

## 2023-07-05 NOTE — Patient Instructions (Signed)
 Consider getting a Kardia Moblie App  Medication Instructions:  Your physician recommends that you continue on your current medications as directed. Please refer to the Current Medication list given to you today.  *If you need a refill on your cardiac medications before your next appointment, please call your pharmacy*  Lab Work: None ordered.  You may go to any [facility] Location for your lab work:  [facility] - [id number] ([facility]) - [address] [phone] [address]  [place] - [address]   [place]  - [address]   [place] - [address] - [address] [employer]  - [address] ([facility]   If you have labs (blood work) drawn today and your tests are completely normal, you will receive your results only by: [employer] (if you have MyChart)  If you have any lab test that is abnormal or we need to change your treatment, we will call you or send a MyChart message to review the results.  Testing/Procedures: None ordered.  Follow-Up: At [facility], you and your health needs are our priority.  As part of our continuing mission to provide you with exceptional heart care, we have created designated Provider Care Teams.  These Care Teams include your primary Cardiologist (physician) and Advanced Practice Providers (APPs -  Physician Assistants and Nurse Practitioners) who all work together to provide you with the care you need, when you need it.  We recommend signing up for the patient portal called MyChart.  Sign up information is provided on this After Visit Summary.  MyChart is used to connect with patients for Virtual Visits (Telemedicine).  Patients are able to view lab/test results, encounter notes, upcoming appointments, etc.  Non-urgent messages can be sent to your provider as well.   To learn more about what you  can do with MyChart, go to [email or web address].    Your next appointment:   As needed  The format for your next appointment:   In Person  Provider:   Manya Sells, MD{or one of the following Advanced Practice Providers on your designated Care Team:   Mertha Abrahams, [place] Merla Starch, [place] Neda Balk, NP  Note: Remote monitoring is used to monitor your Pacemaker/ ICD from home. This monitoring reduces the number of office visits required to check your device to one time per year. It allows us  to keep an eye on the functioning of your device to ensure it is working properly.

## 2023-07-05 NOTE — Telephone Encounter (Addendum)
 I am moving this documentation to a phone note as labs have been challenging to interpret in setting of inconsistent med adherence..  - On 6/13, patient was evaluated for volume excess in setting of not taking diuretics/potassium consistently, off torsemide at least 1 week. I advised taking torsemide 20mg  daily as prescribed, starting KCl 40meq daily (patient requested 10meq tablet rx), and starting spironolactone 25mg  daily. - After baseline labs returned, on 6/16, we advised her to increase her potassium to 40meq QAM/20meq QPM with f/u BMET later this week. - Over the span of subsequent result notes the patient reported varying dosing/consistency with meds making it challenging to know exactly what she was taking.  Today, Maeola Schmidt spoke with the patient today who reported she took: Sunday: no torsemide or spironolactone, 2 of 20meq potassium (not 10meq) Monday: no torsemide or spironolactone, 2 of 20meq potassium  Tuesday: no torsemide or spironolactone, 2 of 20meq potassium  Wednesday: torsemide 20mg , spironolactone 25mg , 2 of 20meq potassium  Thursday: torsemide 20mg , spironolactone 25mg , 2 of 20meq potassium - PM labs with K 3.0  I would recommend the following. Bridgette Campus, if you think she would better process these instructions in person, please let Dr. Meridith Stanford team know to relay to the patient at her EP visit today. - increase potassium to 40meq BID - she had noted diarrhea in the past on potassium so need to follow. She can either take the prior 20's she has if they are not expired, or the new tablets - just be sure taking correct number of tablets - change torsemide to 3x/week for compliance concerns - keep spironolactone at 25mg  daily for now  - BMET on 6/25 at least 3 hours after her medications for that day including potassium supplement - constellation of issues with low potassium may be related to another medical condition such as Cushing's disease - recommend she discuss  further workup with her PCP - please recommend moving gen cards follow-up to 1-2 weeks bringing all meds to that visit - let her know xray was fine  Thank you! Mayci Haning

## 2023-07-05 NOTE — Progress Notes (Signed)
 HPI Amanda Bentley is referred by Dr. Albert Bentley for evaluation of SVT. She is a pleasant obese woman with a h/o PMR, left bundle branch block, HTN, and dyslipdemia. The patient has worn a couple of cardiac monitors in the past year which shows NS SVT up to 218/min but only lasting seconds. No sustained SVT and no ER visits for SVT. She has not had syncope. She has been on fairly high dose steroid therapy. She is transitioning down on prednisone and is at less than 10 mg daily. She has been treated with lopressor . She is adjusting the diuretic and potassium at times on her own.  Allergies  Allergen Reactions   Benadryl  [Diphenhydramine ] Hives   Iohexol      Desc: pt had swelling from iv contrast yrs ago. pt also does not like to take prednisone for it's side effects. she experiences facial tingling and the benadryl  exacerbates this. did fine w/ all premeds and contrast., Onset Date: [id number]      Current Outpatient Medications  Medication Sig Dispense Refill   ALPRAZolam (XANAX) 0.5 MG tablet Take 0.5 mg by mouth 2 (two) times daily as needed for anxiety.     Cholecalciferol (VITAMIN D) 50 MCG (2000 UT) tablet Take 2,000 Units by mouth daily.     metoprolol  tartrate (LOPRESSOR ) 50 MG tablet Take 1 tablet (50 mg total) by mouth 2 (two) times daily. 180 tablet 3   potassium chloride  (KLOR-CON ) 10 MEQ tablet Take 4 tablets (40 mEq total) by mouth daily. 30 tablet 3   PREDNISONE PO Take 8 mg by mouth daily.     spironolactone (ALDACTONE) 25 MG tablet Take 1 tablet (25 mg total) by mouth daily. 30 tablet 3   torsemide (DEMADEX) 20 MG tablet Take 20 mg by mouth daily.     glipiZIDE (GLUCOTROL XL) 5 MG 24 hr tablet Take 5 mg by mouth daily. (Patient not taking: Reported on 07/05/2023)     No current facility-administered medications for this visit.     Past Medical History:  Diagnosis Date   Anxiety    Arthritis    polymyalgia rheumatica   Complication of anesthesia    problems waking  up-3/15   Endometriosis    GERD (gastroesophageal reflux disease)    Hypertension    LBBB (left bundle branch block)    Neuromuscular disorder (HCC)    Other specified cardiac arrhythmias    PSVT   PONV (postoperative nausea and vomiting)     ROS:   All systems reviewed and negative except as noted in the HPI.   Past Surgical History:  Procedure Laterality Date   ARTERY BIOPSY Right 12/25/2022   Procedure: BIOPSY TEMPORAL ARTERY;  Surgeon: Amanda Gaudier, Amanda Aschoff, MD;  Location: [facility];  Service: General;  Laterality: Right;   CARPAL TUNNEL RELEASE Right 03/31/2013   Procedure: RIGHT CARPAL TUNNEL RELEASE;  Surgeon: Amanda Bentley., MD;  Location: [facility];  Service: Orthopedics;  Laterality: Right;   CARPAL TUNNEL RELEASE Left 01/12/2014   Procedure: LEFT CARPAL TUNNEL RELEASE;  Surgeon: Amanda Sample, MD;  Location: [facility];  Service: Orthopedics;  Laterality: Left;   COLONOSCOPY     DILATION AND CURETTAGE OF UTERUS     ERCP     EYE SURGERY     lt cataract 12/15   EYE SURGERY     rt cataract 01/06/14   LAPAROSCOPY  [id number]   explor lap   UPPER GI ENDOSCOPY  Family History  Problem Relation Age of Onset   Lung disease Mother    Stomach cancer Sister    Breast cancer Maternal Grandmother      Social History   Socioeconomic History   Marital status: Widowed    Spouse name: Not on file   Number of children: Not on file   Years of education: Not on file   Highest education level: Not on file  Occupational History   Not on file  Tobacco Use   Smoking status: Never    Passive exposure: Never   Smokeless tobacco: Never  Vaping Use   Vaping status: Never Used  Substance and Sexual Activity   Alcohol use: No   Drug use: No   Sexual activity: Not Currently  Other Topics Concern   Not on file  Social History Narrative   Right Handed   Lives in a one story home with her husband          Husband in hospice   Social  Drivers of Health   Financial Resource Strain: Not on file  Food Insecurity: Not on file  Transportation Needs: Not on file  Physical Activity: Not on file  Stress: Not on file  Social Connections: Unknown (10/07/2022)   Received from [facility]   Social Network    Social Network: Not on file  Intimate Partner Violence: Not At Risk (10/07/2022)   Received from [facility]   HITS    Over the last 12 months how often did your partner physically hurt you?: Never    Over the last 12 months how often did your partner insult you or talk down to you?: Never    Over the last 12 months how often did your partner threaten you with physical harm?: Never    Over the last 12 months how often did your partner scream or curse at you?: Never     BP (!) 140/80   Pulse 78   Ht 5' 6 (1.676 m)   Wt 249 lb (112.9 kg)   SpO2 92%   BMI 40.19 kg/m   Physical Exam:  obese appearing NAD HEENT: Unremarkable Neck:  No JVD, no thyromegally Lymphatics:  No adenopathy Back:  No CVA tenderness Lungs:  Clear with no wheezes HEART:  Regular rate rhythm, no murmurs, no rubs, no clicks Abd:  soft, positive bowel sounds, no organomegally, no rebound, no guarding Ext:  2 plus pulses, 2+ edema, no cyanosis, no clubbing Skin:  No rashes no nodules Neuro:  CN II through XII intact, motor grossly intact  EKG - nsr with LBBB  Assess/Plan: SVT - as she has no sustained SVT, no indication for ablation. Continue beta blocker. LBBB - I described the symptoms she might experience if her conduction system disease were to worsen and she will call us  if this occurs. Obesity - weight loss is recommended.  Amanda Brand [name],MD

## 2023-07-08 DIAGNOSIS — I890 Lymphedema, not elsewhere classified: Secondary | ICD-10-CM | POA: Diagnosis not present

## 2023-07-09 NOTE — Therapy (Deleted)
 OUTPATIENT PHYSICAL THERAPY FEMALE PELVIC EVALUATION   Patient Name: Amanda Bentley MRN: [id number] DOB:Mar 14, 1955, 68 y.o., female Today's Date: 07/09/2023  END OF SESSION:   Past Medical History:  Diagnosis Date   Anxiety    Arthritis    polymyalgia rheumatica   Complication of anesthesia    problems waking up-3/15   Endometriosis    GERD (gastroesophageal reflux disease)    Hypertension    LBBB (left bundle branch block)    Neuromuscular disorder (HCC)    Other specified cardiac arrhythmias    PSVT   PONV (postoperative nausea and vomiting)    Past Surgical History:  Procedure Laterality Date   ARTERY BIOPSY Right 12/25/2022   Procedure: BIOPSY TEMPORAL ARTERY;  Surgeon: Stevie Herlene Righter, MD;  Location: [facility];  Service: General;  Laterality: Right;   CARPAL TUNNEL RELEASE Right 03/31/2013   Procedure: RIGHT CARPAL TUNNEL RELEASE;  Surgeon: Lamar LULLA Leonor Mickey., MD;  Location: [facility];  Service: Orthopedics;  Laterality: Right;   CARPAL TUNNEL RELEASE Left 01/12/2014   Procedure: LEFT CARPAL TUNNEL RELEASE;  Surgeon: Arley Curia, MD;  Location: [facility];  Service: Orthopedics;  Laterality: Left;   COLONOSCOPY     DILATION AND CURETTAGE OF UTERUS     ERCP     EYE SURGERY     lt cataract 12/15   EYE SURGERY     rt cataract 01/06/14   LAPAROSCOPY  [id number]   explor lap   UPPER GI ENDOSCOPY     Patient Active Problem List   Diagnosis Date Noted   Spondylosis without myelopathy or radiculopathy, cervical region 11/11/2019   Mass of right breast 12/01/2012    PCP: Claudene Pellet, MD  REFERRING PROVIDER: Ann Darice CHRISTELLA, FNP   REFERRING DIAG: N81.4 (ICD-10-CM) - Uterovaginal prolapse, unspecified   THERAPY DIAG:  No diagnosis found.  Rationale for Evaluation and Treatment: Rehabilitation  ONSET DATE: ***  SUBJECTIVE:                                                                                                                                                                                            SUBJECTIVE STATEMENT: *** Fluid intake:   PAIN:  Are you having pain? {yes/no:20286} NPRS scale: ***/10 Pain location: {pelvic pain location:27098}  Pain type: {type:313116} Pain description: {PAIN DESCRIPTION:21022940}   Aggravating factors: *** Relieving factors: ***  PRECAUTIONS: None  RED FLAGS: {PT Red Flags:29287}   WEIGHT BEARING RESTRICTIONS: No  FALLS:  Has patient fallen in last 6 months? {fallsyesno:27318}  OCCUPATION: ***  ACTIVITY LEVEL : ***  PLOF: {PLOF:24004}  PATIENT GOALS: ***  PERTINENT HISTORY:  Endometriosis; Neuromuscular disorder;  Sexual abuse: {Yes/No:304960894}  BOWEL MOVEMENT: Pain with bowel movement: {yes/no:20286} Type of bowel movement:{PT BM type:27100} Fully empty rectum: {No/Yes:304960894} Leakage: {Yes/No:304960894} Pads: {Yes/No:304960894} Fiber supplement/laxative {YES/NO AS:20300}  URINATION: Pain with urination: {yes/no:20286} Fully empty bladder: {Yes/No:304960894}*** Stream: {PT urination:27102} Urgency: {YES/NO AS:20300} Frequency: *** Leakage: {PT leakage:27103} Pads: {Yes/No:304960894}  INTERCOURSE:  Ability to have vaginal penetration {YES/NO:21197} Pain with intercourse: {pain with intercourse PA:27099} Dryness{YES/NO AS:20300} Climax: *** Marinoff Scale: ***/3 Laxative:  PREGNANCY: Vaginal deliveries *** Tearing {Yes***/No:304960894} Episiotomy {YES/NO AS:20300} C-section deliveries *** Currently pregnant {Yes***/No:304960894}  PROLAPSE: {PT prolapse:27101}   OBJECTIVE:  Note: Objective measures were completed at Evaluation unless otherwise noted.  DIAGNOSTIC FINDINGS:  ***  PATIENT SURVEYS:  {rehab surveys:24030}  PFIQ-7: ***  COGNITION: Overall cognitive status: {cognition:24006}     SENSATION: Light touch: {intact/deficits:24005}  LUMBAR SPECIAL TESTS:  {lumbar special  test:25242}  FUNCTIONAL TESTS:  {Functional tests:24029}  GAIT: Assistive device utilized: {Assistive devices:23999} Comments: ***  POSTURE: {posture:25561}   LUMBARAROM/PROM:  A/PROM A/PROM  eval  Flexion   Extension   Right lateral flexion   Left lateral flexion   Right rotation   Left rotation    (Blank rows = not tested)  LOWER EXTREMITY ROM:  {AROM/PROM:27142} ROM Right eval Left eval  Hip flexion    Hip extension    Hip abduction    Hip adduction    Hip internal rotation    Hip external rotation    Knee flexion    Knee extension    Ankle dorsiflexion    Ankle plantarflexion    Ankle inversion    Ankle eversion     (Blank rows = not tested)  LOWER EXTREMITY MMT:  MMT Right eval Left eval  Hip flexion    Hip extension    Hip abduction    Hip adduction    Hip internal rotation    Hip external rotation    Knee flexion    Knee extension    Ankle dorsiflexion    Ankle plantarflexion    Ankle inversion    Ankle eversion     (Blank rows = not tested) PALPATION:   General: ***  Pelvic Alignment: ***  Abdominal: ***                External Perineal Exam: ***                             Internal Pelvic Floor: ***  Patient confirms identification and approves PT to assess internal pelvic floor and treatment {yes/no:20286}  PELVIC MMT:   MMT eval  Vaginal   Internal Anal Sphincter   External Anal Sphincter   Puborectalis   Diastasis Recti   (Blank rows = not tested)        TONE: ***  PROLAPSE: ***  TODAY'S TREATMENT:  DATE: ***  EVAL ***   PATIENT EDUCATION:  Education details: *** Person educated: {Person educated:25204} Education method: {Education Method:25205} Education comprehension: {Education Comprehension:25206}  HOME EXERCISE PROGRAM: ***  ASSESSMENT:  CLINICAL IMPRESSION: Patient is a  *** y.o. *** who was seen today for physical therapy evaluation and treatment for ***.   OBJECTIVE IMPAIRMENTS: {opptimpairments:25111}.   ACTIVITY LIMITATIONS: {activitylimitations:27494}  PARTICIPATION LIMITATIONS: {participationrestrictions:25113}  PERSONAL FACTORS: {Personal factors:25162} are also affecting patient's functional outcome.   REHAB POTENTIAL: {rehabpotential:25112}  CLINICAL DECISION MAKING: {clinical decision making:25114}  EVALUATION COMPLEXITY: {Evaluation complexity:25115}   GOALS: Goals reviewed with patient? {yes/no:20286}  SHORT TERM GOALS: Target date: ***  *** Baseline: Goal status: INITIAL  2.  *** Baseline:  Goal status: INITIAL  3.  *** Baseline:  Goal status: INITIAL  4.  *** Baseline:  Goal status: INITIAL  5.  *** Baseline:  Goal status: INITIAL  6.  *** Baseline:  Goal status: INITIAL  LONG TERM GOALS: Target date: ***  *** Baseline:  Goal status: INITIAL  2.  *** Baseline:  Goal status: INITIAL  3.  *** Baseline:  Goal status: INITIAL  4.  *** Baseline:  Goal status: INITIAL  5.  *** Baseline:  Goal status: INITIAL  6.  *** Baseline:  Goal status: INITIAL  PLAN:  PT FREQUENCY: {rehab frequency:25116}  PT DURATION: {rehab duration:25117}  PLANNED INTERVENTIONS: {rehab planned interventions:25118::97110-Therapeutic exercises,97530- Therapeutic [phone]- Neuromuscular re-education,97535- Self Rjmz,02859- Manual therapy}  PLAN FOR NEXT SESSION: ***   Hermes Wafer, PT 07/09/2023, 1:50 PM

## 2023-07-10 ENCOUNTER — Ambulatory Visit: Attending: Family | Admitting: Physical Therapy

## 2023-07-10 NOTE — Telephone Encounter (Signed)
 Pt. Calling bk for results missed call

## 2023-07-11 DIAGNOSIS — Z79899 Other long term (current) drug therapy: Secondary | ICD-10-CM | POA: Diagnosis not present

## 2023-07-12 ENCOUNTER — Ambulatory Visit: Payer: Self-pay | Admitting: Physician Assistant

## 2023-07-12 DIAGNOSIS — E1165 Type 2 diabetes mellitus with hyperglycemia: Secondary | ICD-10-CM | POA: Diagnosis not present

## 2023-07-12 LAB — BASIC METABOLIC PANEL WITH GFR
BUN/Creatinine Ratio: 15 (ref 12–28)
BUN: 12 mg/dL (ref 8–27)
CO2: 21 mmol/L (ref 20–29)
Calcium: 9.3 mg/dL (ref 8.7–10.3)
Chloride: 101 mmol/L (ref 96–106)
Creatinine, Ser: 0.8 mg/dL (ref 0.57–1.00)
Glucose: 121 mg/dL — ABNORMAL HIGH (ref 70–99)
Potassium: 3.4 mmol/L — ABNORMAL LOW (ref 3.5–5.2)
Sodium: 141 mmol/L (ref 134–144)
eGFR: 80 mL/min/{1.73_m2} (ref >59)

## 2023-07-15 ENCOUNTER — Ambulatory Visit: Admitting: Physician Assistant

## 2023-07-17 ENCOUNTER — Encounter (HOSPITAL_COMMUNITY): Payer: Self-pay

## 2023-07-17 ENCOUNTER — Other Ambulatory Visit: Payer: Self-pay

## 2023-07-17 ENCOUNTER — Emergency Department (HOSPITAL_COMMUNITY)
Admission: EM | Admit: 2023-07-17 | Discharge: 2023-07-18 | Disposition: A | Discharge status: Home / Self Care | Attending: Emergency Medicine | Admitting: Emergency Medicine

## 2023-07-17 ENCOUNTER — Emergency Department (HOSPITAL_COMMUNITY)

## 2023-07-17 DIAGNOSIS — R0602 Shortness of breath: Secondary | ICD-10-CM | POA: Diagnosis not present

## 2023-07-17 DIAGNOSIS — R918 Other nonspecific abnormal finding of lung field: Secondary | ICD-10-CM | POA: Diagnosis not present

## 2023-07-17 DIAGNOSIS — R531 Weakness: Secondary | ICD-10-CM | POA: Diagnosis not present

## 2023-07-17 DIAGNOSIS — R600 Localized edema: Secondary | ICD-10-CM | POA: Diagnosis not present

## 2023-07-17 DIAGNOSIS — D72829 Elevated white blood cell count, unspecified: Secondary | ICD-10-CM | POA: Diagnosis not present

## 2023-07-17 DIAGNOSIS — I890 Lymphedema, not elsewhere classified: Secondary | ICD-10-CM | POA: Diagnosis not present

## 2023-07-17 DIAGNOSIS — I10 Essential (primary) hypertension: Secondary | ICD-10-CM | POA: Diagnosis not present

## 2023-07-17 DIAGNOSIS — R2243 Localized swelling, mass and lump, lower limb, bilateral: Secondary | ICD-10-CM | POA: Diagnosis not present

## 2023-07-17 LAB — CBC WITH DIFFERENTIAL/PLATELET
Abs Immature Granulocytes: 0.22 10*3/uL — ABNORMAL HIGH (ref 0.00–0.07)
Basophils Absolute: 0.1 10*3/uL (ref 0.0–0.1)
Basophils Relative: 0 %
Eosinophils Absolute: 0.1 10*3/uL (ref 0.0–0.5)
Eosinophils Relative: 1 %
HCT: 45 % (ref 36.0–46.0)
Hemoglobin: 14.4 g/dL (ref 12.0–15.0)
Immature Granulocytes: 2 %
Lymphocytes Relative: 12 %
Lymphs Abs: 1.3 10*3/uL (ref 0.7–4.0)
MCH: 33.1 pg (ref 26.0–34.0)
MCHC: 32 g/dL (ref 30.0–36.0)
MCV: 103.4 fL — ABNORMAL HIGH (ref 80.0–100.0)
Monocytes Absolute: 1.1 10*3/uL — ABNORMAL HIGH (ref 0.1–1.0)
Monocytes Relative: 10 %
Neutro Abs: 8.5 10*3/uL — ABNORMAL HIGH (ref 1.7–7.7)
Neutrophils Relative %: 75 %
Platelets: 260 10*3/uL (ref 150–400)
RBC: 4.35 MIL/uL (ref 3.87–5.11)
RDW: 14.1 % (ref 11.5–15.5)
WBC: 11.3 10*3/uL — ABNORMAL HIGH (ref 4.0–10.5)
nRBC: 0 % (ref 0.0–0.2)

## 2023-07-17 LAB — BASIC METABOLIC PANEL WITH GFR
Anion gap: 13 (ref 5–15)
BUN: 18 mg/dL (ref 8–23)
CO2: 23 mmol/L (ref 22–32)
Calcium: 9.1 mg/dL (ref 8.9–10.3)
Chloride: 105 mmol/L (ref 98–111)
Creatinine, Ser: 0.89 mg/dL (ref 0.44–1.00)
GFR, Estimated: >60 mL/min (ref >60)
Glucose, Bld: 132 mg/dL — ABNORMAL HIGH (ref 70–99)
Potassium: 3.6 mmol/L (ref 3.5–5.1)
Sodium: 141 mmol/L (ref 135–145)

## 2023-07-17 LAB — BRAIN NATRIURETIC PEPTIDE: B Natriuretic Peptide: 56.8 pg/mL (ref 0.0–100.0)

## 2023-07-17 LAB — TROPONIN I (HIGH SENSITIVITY)
Troponin I (High Sensitivity): 10 ng/L (ref <18)
Troponin I (High Sensitivity): 9 ng/L (ref <18)

## 2023-07-17 LAB — CBG MONITORING, ED: Glucose-Capillary: 167 mg/dL — ABNORMAL HIGH (ref 70–99)

## 2023-07-17 NOTE — ED Triage Notes (Signed)
 Pt sent by PCP for further eval of bilateral leg swelling and weeping from wounds. Pt also c.o feeling generally weak.

## 2023-07-17 NOTE — ED Triage Notes (Signed)
 Pt to er, pt states that she has an auto immune disease, states that she has been on prednisone for the past year, states that she has been having some fluid and swelling in her lower extremities, states that she is here because at pt they wanted her to be seen for the weeping legs and a wound on her R leg.

## 2023-07-17 NOTE — ED Provider Triage Note (Signed)
 Emergency Medicine Provider Triage Evaluation Note  Amanda Bentley , a 68 y.o. female  was evaluated in triage.  Pt complains of lower extremity edema has been ongoing for some time and increased weight gain.  She does endorse some mild shortness of breath worse than her baseline.  No chest pain, fever, chills.  Review of Systems  Positive:  Negative: See above   Physical Exam  BP (!) 162/96 (BP Location: Right Arm)   Pulse 81   Temp 98.4 F (36.9 C)   Resp 16   Ht 5' 6 (1.676 m)   Wt 108.9 kg   SpO2 96%   BMI 38.74 kg/m  Gen:   Awake, no distress   Resp:  Normal effort  MSK:   Moves extremities without difficulty  Other:  3+ pitting edema up to the knees bilaterally.  There is a open wound draining serous fluid in the right lower extremity.  Medical Decision Making  Medically screening exam initiated at 6:43 PM.  Appropriate orders placed.  Amanda Bentley was informed that the remainder of the evaluation will be completed by another provider, this initial triage assessment does not replace that evaluation, and the importance of remaining in the ED until their evaluation is complete.     Amanda Bentley, [place] 07/17/23 [phone]

## 2023-07-18 LAB — CBG MONITORING, ED: Glucose-Capillary: 94 mg/dL (ref 70–99)

## 2023-07-18 MED ORDER — POTASSIUM CHLORIDE 20 MEQ PO PACK
40.0000 meq | PACK | Freq: Two times a day (BID) | ORAL | Status: DC
  Administered 2023-07-18: 40 meq via ORAL
  Filled 2023-07-18: qty 2

## 2023-07-18 MED ORDER — SPIRONOLACTONE 12.5 MG HALF TABLET
25.0000 mg | ORAL_TABLET | Freq: Every day | ORAL | Status: DC
  Administered 2023-07-18: 25 mg via ORAL
  Filled 2023-07-18: qty 2

## 2023-07-18 MED ORDER — METOPROLOL TARTRATE 25 MG PO TABS
50.0000 mg | ORAL_TABLET | Freq: Once | ORAL | Status: AC
  Administered 2023-07-18: 50 mg via ORAL
  Filled 2023-07-18: qty 2

## 2023-07-18 MED ORDER — FUROSEMIDE 10 MG/ML IJ SOLN
20.0000 mg | Freq: Once | INTRAMUSCULAR | Status: AC
  Administered 2023-07-18: 20 mg via INTRAVENOUS
  Filled 2023-07-18: qty 2

## 2023-07-18 NOTE — ED Notes (Signed)
 Pt requested sanitary pads, NT provided

## 2023-07-18 NOTE — ED Notes (Signed)
Assisted pt to the restroom 

## 2023-07-18 NOTE — Discharge Instructions (Addendum)
 See your Physician for recheck.  Continue wound care at home.  Use your compression wraps,

## 2023-07-18 NOTE — ED Notes (Signed)
Gave patient a turkey sandwich 

## 2023-07-18 NOTE — ED Notes (Signed)
 Pt verbalized understanding of discharge instructions all questions answered. Pt brought by wheelchair to ED lobby to await her son for transport.

## 2023-07-18 NOTE — ED Provider Notes (Signed)
[place] EMERGENCY DEPARTMENT AT [facility] Provider Note   CSN: [id number] Arrival date & time: 07/17/23  1646     Patient presents with: Leg Swelling   Amanda Bentley is a 68 y.o. female.   Pt complains of swelling in both of her legs.  Pt states she has some drainage from her right leg.  Pt is taking torsemide  every other day.  Pt's cardiologist advised her to come to the Ed to try to get some fluid off.  Pt has compression wraps at home.    Patient is a 68 year old female with a cc of leg swelling. PMH includes a LBBB and a recent diagnosis of polymyalgia rheumatic for a year. She takes high dose prednisone for the polymyalgia rheumatica. Patient saw cardiology last Friday and the cardiologist stated that she should come to the ER for worsening leg swelling. She waited and said she didn't want to go until she saw her physical therapist on Wednesday (yeserday) and the PT also recommended she come to the ER. She states that she goes  to PT to help her edema. 2 weeks ago she started going back after a 3 week PT hiatus due to change in insurance. She states that she usually goes to PT 1-2 times a week. The cardiologist is concerned because she has gained about 10 pounds in 2 weeks and she has started having a wound that is draining clear fluid from it (possible concerns of weeping). She said it's been consistent draining. She states that she does not have chest pain but has had SOB for the last 2 years ever since she had COVID. She states that her husband died a year ago and ever since then has had worsening health ever since. Pt has a history of LBBB.           Prior to Admission medications   Medication Sig Start Date End Date Taking? Authorizing Provider  metoprolol  tartrate (LOPRESSOR ) 50 MG tablet Take 1 tablet (50 mg total) by mouth 2 (two) times daily. 11/30/22 07/17/24 Yes Tolia, Sunit, DO  potassium chloride  SA (KLOR-CON  M20) 20 MEQ tablet Take 2 tablets (40 mEq  total) by mouth 2 (two) times daily. Patient taking differently: Take 20 mEq by mouth daily. 07/05/23 10/03/23 Yes Dunn, Dayna N, PA-C  ALPRAZolam (XANAX) 0.5 MG tablet Take 0.5 mg by mouth 2 (two) times daily as needed for anxiety. Patient not taking: Reported on 07/18/2023    [provider]  Cholecalciferol (VITAMIN D) 50 MCG (2000 UT) tablet Take 2,000 Units by mouth daily.    [provider]  glipiZIDE (GLUCOTROL XL) 5 MG 24 hr tablet Take 5 mg by mouth daily. Patient not taking: No sig reported 01/03/23   [provider]  PREDNISONE PO Take 8 mg by mouth daily.    [provider]  spironolactone  (ALDACTONE ) 25 MG tablet Take 1 tablet (25 mg total) by mouth daily. 06/28/23 09/26/23  Dunn, Dayna N, PA-C  torsemide  (DEMADEX ) 20 MG tablet Take 1 tablet (20 mg total) by mouth every Monday, Wednesday, and Friday. 07/05/23   Dunn, Dayna N, PA-C    Allergies: Benadryl  [diphenhydramine ] and Iohexol    Review of Systems  All other systems reviewed and are negative.   Updated Vital Signs BP (!) 162/90 (BP Location: Left Arm)   Pulse 88   Temp 98.1 F (36.7 C)   Resp 18   Ht 5' 6 (1.676 m)   Wt 108.9 kg  SpO2 94%   BMI 38.74 kg/m   Physical Exam Vitals reviewed.  Constitutional:      Appearance: Normal appearance.  HENT:     Nose: Nose normal.     Mouth/Throat:     Mouth: Mucous membranes are moist.  Cardiovascular:     Rate and Rhythm: Normal rate and regular rhythm.  Pulmonary:     Effort: Pulmonary effort is normal.     Breath sounds: Normal breath sounds.  Abdominal:     General: Abdomen is flat.  Musculoskeletal:     Cervical back: Normal range of motion.     Right lower leg: Edema present.     Left lower leg: Edema present.  Neurological:     General: No focal deficit present.     Mental Status: She is alert.  Psychiatric:        Mood and Affect: Mood normal.     (all labs ordered are listed, but only abnormal results are  displayed) Labs Reviewed  CBC WITH DIFFERENTIAL/PLATELET - Abnormal; Notable for the following components:      Result Value   WBC 11.3 (*)    MCV 103.4 (*)    Neutro Abs 8.5 (*)    Monocytes Absolute 1.1 (*)    Abs Immature Granulocytes 0.22 (*)    All other components within normal limits  BASIC METABOLIC PANEL WITH GFR - Abnormal; Notable for the following components:   Glucose, Bld 132 (*)    All other components within normal limits  CBG MONITORING, ED - Abnormal; Notable for the following components:   Glucose-Capillary 167 (*)    All other components within normal limits  BRAIN NATRIURETIC PEPTIDE  TROPONIN I (HIGH SENSITIVITY)  TROPONIN I (HIGH SENSITIVITY)    EKG: None  Radiology: DG Chest 2 View Result Date: 07/17/2023 CLINICAL DATA:  Lower extremity edema and shortness of breath EXAM: CHEST - 2 VIEW COMPARISON:  07/03/2023 FINDINGS: The heart size and mediastinal contours are within normal limits. Left basilar atelectasis or infiltrates. The lungs are otherwise clear. No pleural effusion or pneumothorax. The visualized skeletal structures are unremarkable. IMPRESSION: 1. Left basilar atelectasis or infiltrates. Electronically Signed   By: Norman Gatlin M.D.   On: 07/17/2023 19:42     Procedures   Medications Ordered in the ED - No data to display                                  Medical Decision Making Pt complains of swelling to both lower legs.  Pt reports she   Amount and/or Complexity of Data Reviewed Labs: ordered. Decision-making details documented in ED Course.    Details: Labs ordered reviewed and interpreted. Wbc count 11.3  glucose 132  Radiology: ordered and independent interpretation performed. Decision-making details documented in ED Course.    Details: Chest xray shows left basilar atelectasis  ECG/medicine tests: ordered and independent interpretation performed. Decision-making details documented in ED Course.  Risk Prescription drug  management. Risk Details: Pt given IV lasix.   Wound care to weeping area.    EKG normal sinus, Left bundle branch block.      Final diagnoses:  Peripheral edema    ED Discharge Orders     None      An After Visit Summary was printed and given to the patient.     Flint Sonny POUR, PA-C 07/18/23 1306    Bernard Drivers, MD 07/18/23  1319  

## 2023-07-23 DIAGNOSIS — I890 Lymphedema, not elsewhere classified: Secondary | ICD-10-CM | POA: Diagnosis not present

## 2023-07-23 DIAGNOSIS — R531 Weakness: Secondary | ICD-10-CM | POA: Diagnosis not present

## 2023-07-25 ENCOUNTER — Telehealth: Payer: Self-pay | Admitting: Physician Assistant

## 2023-07-25 ENCOUNTER — Other Ambulatory Visit: Payer: Self-pay | Admitting: *Deleted

## 2023-07-25 DIAGNOSIS — Z79899 Other long term (current) drug therapy: Secondary | ICD-10-CM

## 2023-07-25 NOTE — Telephone Encounter (Addendum)
 Per chart review, ER visit was already 8 days ago. BNP has become normal so some of this may be noncardiac in etiology. Suspect the steroids for her rheumatologic disease is contributing; I have also had suspicion she could have something like Cushing's disease as well which would need to be evaluated by her PCP. Sometimes other conditions like pelvic/abdominal issues can cause lower extremity edema as well, this can be evaluated by primary care as well. ED only gave her 20mg  IV Lasix  x1 so I am not surprised this did not make a drastic difference.  Please find out what medications she has been taking recently as she has tended to adjust her own regimen. Need to be very specific when you ask her (specific days/doses). Please also get her back on the schedule to see APP in person ASAP. I reviewed chart and do not see that I have availability until 8/25, needs to be sooner than then. Thank you.

## 2023-07-25 NOTE — Telephone Encounter (Signed)
 Spoke with pt regarding the information she had to share. Pt wanted to make sure that Dayna Dunn, PA-C had seen the report from her trip to the ED. Pt stated they gave her IV lasix  that did not help and told her that her cardiologist should be the one to manage her edema. The pt stated she still has swelling in her lower legs. Pt denies shortness of breath. Pt also stated her potassium at the hospital was normal. Pt was told Dayna would be notified of the information provided. Pt verbalized understanding. All questions if any were answered.

## 2023-07-25 NOTE — Telephone Encounter (Signed)
 Call placed to pt regarding message below. Per pt, she takes the Torsemide  20 mg every other day, if she has to skip a day, she will take it the next day.  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   She confirms taking Potassium 20 meq daily and Spironolactone  25 mg daily.  Per Raphael Bring, PA-C, have scheduled her to see Dr. Michele 08/05/23 11:40, pt aware to arrive at 11:20.  Pt will come to [facility] tomorrow, 07/26/23, for BMET.   Order in and released.   Per Raphael Dunn's Secure chat:  Delon confirmed patient is now taking torsemide  every other day except Sundays (previously did not wish to take daily), potassium 20meq daily (does not wish to take higher doses due to stomach upset), and spironolactone  25mg  daily. Patient wants to have follow-up labwork to recheck her electrolytes/kidney function.   Plan:  - recheck BMET in AM  - we offered increase in spironolactone  to 50mg  daily but patient prefers to wait for labs before adjusting medications, aware this would be likely Monday  - continue torsemide  and potassium as she is doing since she does not want to take increased doses - patient will be scheduled for sooner follow-up - I do not have availability until August - would still recommend f/u PCP/rheumatology as previously mentioned to exclude comorbid conditions contributing to presentation  DD Good?

## 2023-07-25 NOTE — Telephone Encounter (Signed)
 Pt called in aksing to speak with [name], RN if possible. States she has to information to give her.

## 2023-07-26 DIAGNOSIS — Z79899 Other long term (current) drug therapy: Secondary | ICD-10-CM | POA: Diagnosis not present

## 2023-07-27 LAB — BASIC METABOLIC PANEL WITH GFR
BUN/Creatinine Ratio: 18 (ref 12–28)
BUN: 16 mg/dL (ref 8–27)
CO2: 19 mmol/L — ABNORMAL LOW (ref 20–29)
Calcium: 9.6 mg/dL (ref 8.7–10.3)
Chloride: 104 mmol/L (ref 96–106)
Creatinine, Ser: 0.88 mg/dL (ref 0.57–1.00)
Glucose: 117 mg/dL — ABNORMAL HIGH (ref 70–99)
Potassium: 3.5 mmol/L (ref 3.5–5.2)
Sodium: 146 mmol/L — ABNORMAL HIGH (ref 134–144)
eGFR: 72 mL/min/1.73 (ref >59)

## 2023-07-29 ENCOUNTER — Ambulatory Visit: Payer: Self-pay | Admitting: Physician Assistant

## 2023-07-29 DIAGNOSIS — I890 Lymphedema, not elsewhere classified: Secondary | ICD-10-CM | POA: Diagnosis not present

## 2023-07-29 DIAGNOSIS — Z79899 Other long term (current) drug therapy: Secondary | ICD-10-CM

## 2023-07-31 ENCOUNTER — Other Ambulatory Visit: Payer: Self-pay | Admitting: *Deleted

## 2023-07-31 DIAGNOSIS — I890 Lymphedema, not elsewhere classified: Secondary | ICD-10-CM | POA: Diagnosis not present

## 2023-07-31 DIAGNOSIS — Z79899 Other long term (current) drug therapy: Secondary | ICD-10-CM

## 2023-07-31 MED ORDER — SPIRONOLACTONE 50 MG PO TABS: 50.0000 mg | ORAL_TABLET | Freq: Every day | ORAL | 3 refills | Status: DC

## 2023-08-05 ENCOUNTER — Ambulatory Visit: Admitting: Cardiology

## 2023-08-05 DIAGNOSIS — I890 Lymphedema, not elsewhere classified: Secondary | ICD-10-CM | POA: Diagnosis not present

## 2023-08-05 DIAGNOSIS — R600 Localized edema: Secondary | ICD-10-CM | POA: Diagnosis not present

## 2023-08-05 DIAGNOSIS — M62838 Other muscle spasm: Secondary | ICD-10-CM | POA: Diagnosis not present

## 2023-08-07 ENCOUNTER — Ambulatory Visit: Admitting: Physician Assistant

## 2023-08-07 ENCOUNTER — Ambulatory Visit: Admitting: Cardiology

## 2023-08-09 DIAGNOSIS — E1165 Type 2 diabetes mellitus with hyperglycemia: Secondary | ICD-10-CM | POA: Diagnosis not present

## 2023-08-10 ENCOUNTER — Other Ambulatory Visit: Payer: Self-pay

## 2023-08-10 ENCOUNTER — Ambulatory Visit (HOSPITAL_BASED_OUTPATIENT_CLINIC_OR_DEPARTMENT_OTHER)
Admission: RE | Admit: 2023-08-10 | Discharge: 2023-08-10 | Disposition: A | Discharge status: Home / Self Care | Source: Ambulatory Visit | Attending: Emergency Medicine | Admitting: Emergency Medicine

## 2023-08-10 ENCOUNTER — Emergency Department (HOSPITAL_BASED_OUTPATIENT_CLINIC_OR_DEPARTMENT_OTHER)

## 2023-08-10 ENCOUNTER — Encounter (HOSPITAL_BASED_OUTPATIENT_CLINIC_OR_DEPARTMENT_OTHER): Payer: Self-pay

## 2023-08-10 ENCOUNTER — Emergency Department (HOSPITAL_BASED_OUTPATIENT_CLINIC_OR_DEPARTMENT_OTHER)
Admission: EM | Admit: 2023-08-10 | Discharge: 2023-08-10 | Disposition: A | Discharge status: Home / Self Care | Attending: Emergency Medicine | Admitting: Emergency Medicine

## 2023-08-10 DIAGNOSIS — M79604 Pain in right leg: Secondary | ICD-10-CM

## 2023-08-10 DIAGNOSIS — M25561 Pain in right knee: Secondary | ICD-10-CM | POA: Diagnosis not present

## 2023-08-10 DIAGNOSIS — L03115 Cellulitis of right lower limb: Secondary | ICD-10-CM | POA: Insufficient documentation

## 2023-08-10 DIAGNOSIS — L0390 Cellulitis, unspecified: Secondary | ICD-10-CM

## 2023-08-10 DIAGNOSIS — M7989 Other specified soft tissue disorders: Secondary | ICD-10-CM | POA: Diagnosis not present

## 2023-08-10 DIAGNOSIS — R609 Edema, unspecified: Secondary | ICD-10-CM | POA: Diagnosis not present

## 2023-08-10 DIAGNOSIS — R600 Localized edema: Secondary | ICD-10-CM | POA: Diagnosis not present

## 2023-08-10 DIAGNOSIS — M79661 Pain in right lower leg: Secondary | ICD-10-CM | POA: Diagnosis not present

## 2023-08-10 MED ORDER — DOXYCYCLINE HYCLATE 100 MG PO CAPS: 100.0000 mg | ORAL_CAPSULE | Freq: Two times a day (BID) | ORAL | 0 refills | Status: DC

## 2023-08-10 MED ORDER — DOXYCYCLINE HYCLATE 100 MG PO TABS
100.0000 mg | ORAL_TABLET | Freq: Once | ORAL | Status: AC
  Administered 2023-08-10: 100 mg via ORAL
  Filled 2023-08-10: qty 1

## 2023-08-10 MED ORDER — ACETAMINOPHEN 325 MG PO TABS
650.0000 mg | ORAL_TABLET | Freq: Once | ORAL | Status: AC
  Administered 2023-08-10: 650 mg via ORAL
  Filled 2023-08-10: qty 2

## 2023-08-10 MED ORDER — OXYCODONE HCL 5 MG PO TABS: 5.0000 mg | ORAL_TABLET | Freq: Four times a day (QID) | ORAL | 0 refills | Status: AC | PRN

## 2023-08-10 NOTE — ED Provider Notes (Signed)
 Outpatient ultrasound was negative for DVT.  These findings were relayed to the patient.   Lenor Hollering, MD 08/10/23 [phone]

## 2023-08-10 NOTE — Discharge Instructions (Addendum)
 Please see to [facility] emergency department for your DVT study outpatient at 3 PM, I have arranged the scan for you to be done at that time.  I do suspect that this is likely a mild cellulitis from your chronic swelling in your legs.  Take next dose of antibiotic at dinnertime tonight.  I have written you for a narcotic pain medicine for breakthrough pain called Roxicodone .  This medication is sedating so please be careful with its use.  Follow-up with your primary care doctor.  Return if symptoms worsen.

## 2023-08-10 NOTE — ED Triage Notes (Signed)
 Pt presents with right leg swelling, redness and pain X 3 days. Worsened yesterday. Denies SOB, any other symptoms.

## 2023-08-10 NOTE — ED Provider Notes (Signed)
 [facility] Provider Note   CSN: [id number] Arrival date & time: 08/10/23  1017     Patient presents with: Leg Pain   Amanda Bentley is a 68 y.o. female.   Patient here with right lower leg pain.  Pain mostly in the right knee but also goes down into her shin.  She has history of chronic lymphedema.  She is on diuretics for this.  She has had some increased weeping from the right leg.  Some redness on the right shin now.  She denies any trauma.  Denies any weakness numbness tingling.  Denies history of blood clots.  She would like an ultrasound to rule out blood clot.  She denies any fevers or chills.  She follows with physical therapy for this as well.  She has a history of anxiety as well.  The history is provided by the patient.       Prior to Admission medications   Medication Sig Start Date End Date Taking? Authorizing Provider  doxycycline  (VIBRAMYCIN ) 100 MG capsule Take 1 capsule (100 mg total) by mouth 2 (two) times daily. 08/10/23  Yes Melik Blancett, DO  oxyCODONE  (ROXICODONE ) 5 MG immediate release tablet Take 1 tablet (5 mg total) by mouth every 6 (six) hours as needed for up to 10 doses. 08/10/23  Yes Nandita Mathenia, DO  ALPRAZolam (XANAX) 0.5 MG tablet Take 0.5 mg by mouth 2 (two) times daily as needed for anxiety. Patient not taking: Reported on 07/18/2023    [provider]  Cholecalciferol (VITAMIN D) 50 MCG (2000 UT) tablet Take 2,000 Units by mouth daily.    [provider]  glipiZIDE (GLUCOTROL XL) 5 MG 24 hr tablet Take 5 mg by mouth daily. Patient not taking: No sig reported 01/03/23   [provider]  methylPREDNISolone  (MEDROL ) 4 MG tablet Take 1. 75 tablets by mouth daily for 30 days,  then 1.5 tablet daily for 30 days, then 1.25 tablets for 30 days    [provider]  metoprolol  tartrate (LOPRESSOR ) 50 MG tablet Take 1 tablet (50 mg total) by mouth 2 (two) times daily. 11/30/22  07/17/24  Tolia, Sunit, DO  potassium chloride  SA (KLOR-CON  M20) 20 MEQ tablet Take 2 tablets (40 mEq total) by mouth 2 (two) times daily. Patient taking differently: Take 20 mEq by mouth daily. 07/05/23 10/03/23  Dunn, Dayna N, PA-C  spironolactone  (ALDACTONE ) 50 MG tablet Take 1 tablet (50 mg total) by mouth daily. 07/31/23   Dunn, Dayna N, PA-C  torsemide  (DEMADEX ) 20 MG tablet Take 1 tablet (20 mg total) by mouth every Monday, Wednesday, and Friday. Patient taking differently: Take 20 mg by mouth every other day. 07/05/23   Dunn, Dayna N, PA-C    Allergies: Benadryl  [diphenhydramine ] and Iohexol    Review of Systems  Updated Vital Signs BP (!) 182/84 (BP Location: Right Arm)   Pulse 73   Temp 98.3 F (36.8 C) (Oral)   Resp (!) 22   Physical Exam Vitals and nursing note reviewed.  Constitutional:      General: She is not in acute distress.    Appearance: She is well-developed. She is not ill-appearing.  HENT:     Head: Normocephalic and atraumatic.     Nose: Nose normal.     Mouth/Throat:     Mouth: Mucous membranes are moist.  Eyes:     Extraocular Movements: Extraocular movements intact.     Conjunctiva/sclera: Conjunctivae normal.  Pupils: Pupils are equal, round, and reactive to light.  Cardiovascular:     Rate and Rhythm: Normal rate and regular rhythm.     Pulses: Normal pulses.     Heart sounds: No murmur heard. Pulmonary:     Effort: Pulmonary effort is normal.     Breath sounds: Normal breath sounds.  Abdominal:     Palpations: Abdomen is soft.     Tenderness: There is no abdominal tenderness.  Musculoskeletal:        General: Tenderness present. No swelling.     Cervical back: Normal range of motion and neck supple.     Right lower leg: Edema present.     Left lower leg: Edema present.     Comments: Tenderness to the right anterior knee, no calf tenderness  Skin:    General: Skin is warm and dry.     Capillary Refill: Capillary refill takes less than 2  seconds.     Findings: Erythema present.     Comments: She has got some mild erythema to the right anterior shin but no purulent drainage  Neurological:     Mental Status: She is alert.  Psychiatric:        Mood and Affect: Mood normal.     (all labs ordered are listed, but only abnormal results are displayed) Labs Reviewed - No data to display  EKG: None  Radiology: No results found.   Procedures   Medications Ordered in the ED  doxycycline  (VIBRA -TABS) tablet 100 mg (has no administration in time range)  acetaminophen  (TYLENOL ) tablet 650 mg (has no administration in time range)                                    Medical Decision Making Amount and/or Complexity of Data Reviewed Radiology: ordered.  Risk OTC drugs. Prescription drug management.   Amanda Bentley is here with right knee pain redness to the right lower leg.  Requesting DVT study.  Unremarkable vitals.  No fever.  She is neurovascular neuromuscular intact on exam.  She has chronic lymphedema and follows with PT for this.  I think she might have mild cellulitis of the right anterior shin we will start her on antibiotics for this.  Will get an x-ray of the right knee to make sure there is no major effusion or malalignment or fracture.  She denies any trauma.  Have no concern for septic joint.  I have lower suspicion for DVT.  We do not have ultrasound here today but will have her arrange for an outpatient ultrasound [facility] today.  Will give her a dose of doxycycline  here.  Will write her for oxycodone  for breakthrough pain.  Ultimately she had blood work done fairly recently here in the ED.  She had normal BNP and troponin.  She has no history of heart failure.  She is not having any chest pain shortness of breath and overall suspect that this is mild cellulitis from chronic lymphedema.  I have made the [facility] team aware for her to have her ultrasound done on an outpatient basis to see if her repeat ED  visit.  Overall x-ray today showed no acute fracture or malalignment per my review interpretation.  Patient discharged.  Recommended that she continue antibiotics take pain medicine and go for ultrasound to rule out DVT, arranged for at 3 pm today.  This chart was dictated using voice recognition  software.  Despite best efforts to proofread,  errors can occur which can change the documentation meaning.      Final diagnoses:  Cellulitis, unspecified cellulitis site  Right leg pain    ED Discharge Orders          Ordered    US  Venous Img Lower Unilateral Right        08/10/23 1027    oxyCODONE  (ROXICODONE ) 5 MG immediate release tablet  Every 6 hours PRN        08/10/23 1034    doxycycline  (VIBRAMYCIN ) 100 MG capsule  2 times daily        08/10/23 1034               Kloee Ballew, DO 08/10/23 1110

## 2023-08-12 DIAGNOSIS — I890 Lymphedema, not elsewhere classified: Secondary | ICD-10-CM | POA: Diagnosis not present

## 2023-08-13 ENCOUNTER — Other Ambulatory Visit: Payer: Self-pay | Admitting: Family Medicine

## 2023-08-13 DIAGNOSIS — Z1231 Encounter for screening mammogram for malignant neoplasm of breast: Secondary | ICD-10-CM

## 2023-08-14 DIAGNOSIS — I890 Lymphedema, not elsewhere classified: Secondary | ICD-10-CM | POA: Diagnosis not present

## 2023-08-14 NOTE — Progress Notes (Signed)
 Daily Treatment  Patient Name: Amanda Bentley Date of Birth: [birth year] Today's Date: August 14, 2023 Referring Provider: Claudene Alberta DASEN, MD Visit #: 7 of   Start of Care Date: July 08, 2023 Certification Period: 07/08/23 to 10/06/23  Date for Progress Report: 09/12/23 or by visit #10   Precautions:  Precautions: Polymyalgia rheumatica, hx of cellulitis   Discussed with: Patient   PT Diagnosis: B Le lymphedema  PT Prognosis: Fair   Additional Details: multiple co-morbidities, CHF   Subjective/History   Patient presents today I'm not doing well. She reports changing antibiotic and her pain increase. She reports increase in weeping. She reports not having any strength today.    Pain:  5/10 pain currently  Burning   BP: 150/88 HR: 90 O2:95%    Objective  Verbal consent prior to palpation, special testing and other physical assessment was obtained: YES  Patient Specific Functional Scale  The PSFS is a reliable and valid outcome measurement tool which allows clinicians to determine activity limitations and functional ability. The PSFS can be used over a large number of patients with varying degrees of independence and diagnoses. Activity 1 Score: 3 Activity 2 Score: 3 Activity 3 Score: 3 PSFS Score: 3 PSFS Score change between initial evaluation and last documented measurement: -1.67  1. Wearing shoes  2. Yard work 3. Chores  PSFS score generated by therapist based on objective measures.   Location/Type of Lymphedema: Location: Bilateral Leg  Type: 3- Irreversible and tissue changes    Palpation: Mild tenderness was reported with palpation of R anterior shin and lateral ankle. Tightness of B hamstrings and gastrocs was noted.   Joint Mobility: Joint Mobility was found to be hypomobile at B ankles   Integumentary: The following areas of impairment were noted: weeping of R leg x5 Weeping WNL Appropriate for Ethnicity and Hemosiderin Pitting Puffy Stemmer's  Sign: positive   Cardiovascular and Pulmonary: The patient is without evidence of cardiovascular or pulmonary distress at this time.   Lower Extremity ROM & Strength: Anything left blank was deferred at this time              []  ROM WFL in all planes                                                                                     []  Strength WFL Grossly in all planes          Motion (Normal) AROM PROM Strength Comments   Right Left Right Left Right Left           Hip:  Flexion (0-120o)         3+ 3+    Extension (0-20o)                Abduction (0-45o)         4-. 4-    Adduction (0-30o)         4+ 4+    ER (0-50o)                IR (0-45o)  Knee:  Flexion (0-130o)         4- 4    Extension (0o)         4- 4           Ankle:  Dorsiflexion (0-20o)                Plantarflexion (0-45o)                Inversion (0-20o)                Eversion (0-15o)                       Great Toe:  Extension (0-20o)                   08/14/23 Edema Objective Measurements  LE - Circumference Right  left  Metatarsals 26 27  Midfoot 27.3 27.6  Ankle 35 31  10 cm proximal to ankle 32.5 30.6  20 cm proximal to ankle 40.4 39.2  30 cm proximal to ankle 42.3 42  Knee crease 42.7 42  10 cm proximal to knee 51 52  20 cm proximal to knee      Right LE Total Volume      Left LE Total Volume      LE percentage Difference                08/14/23             Today's Treatment/Intervention    Interventions Performed    Anything left blank was deferred at this time During all sensitive area treatments, patient provided continual verbal consent before and during each intervention.  Remote Therapeutic Monitoring:   Therapeutic Ex [phone]):  Objective measures taken   HELD  LAQ 2x10  Ankle pumps x30  STS 2x5 from raised plinth  Glute set 15x5sec  Seated heel raises   Therapeutic Act (02469):    Manual (02859): HELD  MLD to B LE in reclined position     Neuro Re-Ed (02887):    Gait Training [phone]):    Modalities:  No modalities used at this time  Education: Reviewed the importance of skin care, MLD, compression garment and remedial exercises with good understanding.  Pt given instruction and demonstration on anatomy and physiology on lymphatic system. Pt instructed on diaphragmatic breathing and CLD.  Reviewed with patient about Complete Decongestive Therapy and what is required to be successful.  Spoke with patient about the attendance policy with good patient understanding. Education on lymphedema, treatments for lymphedema, HEP including deep breathing, signs and symptoms of cellulitis and prevention of triggers for lymphedema. Self-Care/Home Management [phone]):  The patient was educated regarding: therapy diagnosis, anatomy and physiology, treatment plan, rationale, therapeutic goals, expectations, home exercise program, and precautions (healing and ROM). All patient questions were entertained and answered today. The education provided was required by a skilled therapist through active lecturing, demonstration and/or provision of literature in order to optimize independence with self-care and home management (including but not limited to ADL training, compensatory training, safety and use of adaptive equipment).   -edu on pump use (DO NOT USE when cellulitis is present)  -edu on compression wear (DO NOT USE on top of active cellulitis -edu on how to keep legs clean   - wounds cleaned and covered with non-adherent gauze pad   Patient verbalized understanding/agreement with education provided.   HEP Provided:  Patient was seen today for initial physical therapy (  PT) evaluation and received education on her condition, treatment plan, rationale, therapeutic goals and expectations were discussed. A home exercise program (HEP) was initiated; patient verbalized and demonstrated proper technique of HEP.   Skilled PT required for exercise  performance to ensure proper technique for continued safe & effective progression.  Each CPT code is separate and distinct therefore medically necessary.   Goals  Start Date: July 08, 2023 End date: 10/06/23 Patient Goal: decreased edema and increase energy    Pt will verbalize three strategies for proper skin care to reduce risk of infection. MET Patient will be educated in lymphedema diagnosis and treatment options in order to optimize functional outcome. PROGRESSING Will assess patient for proper compression garments in order to provide the optimal containment and long term management of lymphedema. PROGERSSING  Pt will be able to don/doff compression garments indep in order to manage lymphedema long term. Pt will be indep with performing self MLD or instructed on proper usage of his/her pneumatic pump in order to promote good lymph flow. Pt will be indep with remedial exercises and be given a HEP in order to maintain mobility. Patient/caregiver independent in prevention/self-care management principles including self-massage/bandaging/lymphedema exercises and long term management plan for edema. Pt will be able to identify signs and symptoms of cellulitis in order to prevent skin infection Pt will be able to decrease limb circumference by 6cm in order to have a better shoe/clothing fit. Pt will be able to perform skin inspection and demonstrate proper skin care to reduce risk of skin breakdown.    Assessment   Assessment Details: The patient presents to physical therapy today for B LE lymphedema. She comes in with continued signs of cellulitis infection- pain, weeping/ white pus, and is warm to the touch. She is seeing her PCP tomorrow. Pt's edema has gone down slightly since Monday. She has a new spot of serous weeping on L leg. Legs were cleaned and covered. Continued to edu pt on how to keep legs clean. She was top not to wear compression of pneumatic pump until infection is cleared. Pt  noted verbal understanding. No MLD done today to prevent spread of infection.   Has there been a change in where the patient lives or who the patient lives with since initial evaluation: No Does the beneficiary require this outpatient therapy plan of care in order to return to a premorbid (or reside in a new) living environment? no            The plan of care has been provided to the physician either by fax or via Dimensions/Epic but the plan of care has not been signed or returned, however, the patient is currently under the care of a physician at the time of therapy services provided. The patient's initial evaluation and all follow-up treatment/progress notes are indicative of the medical necessity of treatment and the patient's need for care.    The patient requires continued skilled Physical Therapy services through the use of planned therapy and modality interventions listed below in order to address the impairments mentioned above/at initial evaluation.  Services to be provided are reasonable and medically necessary for the diagnosis and/or treatment of illness/injury in order to improve the function.  Services to be provided are of appropriate amount, duration and frequency within accepted standards of medical practice per the diagnosis.  Upon system's review, no comorbid conditions were identified to contraindicate therapeutic interventions.   Plan  Plan(s) for next session Continue to progress as able.  Therapy options:  Frequency: 1x/week Duration of Time:  6weeks  Patient/caregiver reported no cultural and/or religious practices that should be considered in patient's treatment plan. Patient or caregiver informed and in agreement with treatment plan risks and benefits.  Treatment Time   Today's Evaluation/Treatment: 1145 - 1230 Total Time: 45 min  Charges   Total Time Code Treatment Minutes: 45  PT Therapeutic Time Entry Therapeutic Exercise Time Entry: 15 (1) Self  Care/Home Management Training Time Entry: 25 (1)  Any remaining time not accounted for includes time for set up/demonstration, reasoning for exercise and technique correction.   Corinne May, DPT 08/14/2023 12:44 PM  [facility] [address] [place] [address] Dept: [phone] Dept Fax: [phone]  Two patient identifiers, Name and DOB, were confirmed prior to the initiation of therapy this date.  If patient returns to clinic with variance in plan of care, then it may be attributable to one or more of the following factors: preferred clinician availability, appointment time request availability, therapy/pool appointment availability, major holiday with clinic closure, caregiver availability, patient transportation, conflicting medical appointment, inclement weather, patient illness, and/or scheduling error.  Note: If patient does not return for follow up visit(s) related to this episode of care, this note will serve as their discharge note from physical therapy, as the patient has discharged themselves and a status on updated goals or PSFS is not available. Refer to previous note for discharge diagnosis and prognosis.  Portions of this note may have been entered using voice recognition software. Minor syntax, contextual, and spelling errors may be related to the use of this software and were not intentional. If corrections are necessary, please contact provider.

## 2023-08-15 ENCOUNTER — Encounter: Payer: Self-pay | Admitting: Cardiology

## 2023-08-15 ENCOUNTER — Ambulatory Visit: Attending: Cardiology | Admitting: Cardiology

## 2023-08-15 VITALS — BP 154/90 | HR 74 | Resp 16 | Ht 66.0 in | Wt 254.7 lb

## 2023-08-15 DIAGNOSIS — I471 Supraventricular tachycardia, unspecified: Secondary | ICD-10-CM

## 2023-08-15 DIAGNOSIS — R609 Edema, unspecified: Secondary | ICD-10-CM | POA: Diagnosis not present

## 2023-08-15 DIAGNOSIS — R0602 Shortness of breath: Secondary | ICD-10-CM | POA: Diagnosis not present

## 2023-08-15 DIAGNOSIS — Z6841 Body Mass Index (BMI) 40.0 and over, adult: Secondary | ICD-10-CM

## 2023-08-15 DIAGNOSIS — I10 Essential (primary) hypertension: Secondary | ICD-10-CM

## 2023-08-15 DIAGNOSIS — I447 Left bundle-branch block, unspecified: Secondary | ICD-10-CM

## 2023-08-15 DIAGNOSIS — M79605 Pain in left leg: Secondary | ICD-10-CM | POA: Diagnosis not present

## 2023-08-15 DIAGNOSIS — E66813 Obesity, class 3: Secondary | ICD-10-CM

## 2023-08-15 DIAGNOSIS — R002 Palpitations: Secondary | ICD-10-CM | POA: Diagnosis not present

## 2023-08-15 DIAGNOSIS — M79604 Pain in right leg: Secondary | ICD-10-CM | POA: Diagnosis not present

## 2023-08-15 MED ORDER — TORSEMIDE 10 MG PO TABS: 10.0000 mg | ORAL_TABLET | Freq: Every day | ORAL | 3 refills | Status: AC

## 2023-08-15 NOTE — Progress Notes (Signed)
 Cardiology Office Note:  .   Date:  08/15/2023  ID:  Amanda Bentley, DOB [birth year], MRN [id number] PCP:  Claudene Pellet, MD  Former Cardiology Providers: Elspeth JAYSON Kitten, MD  [facility] Providers Cardiologist:  Madonna Large, DO , [facility] (established care 07/27/2020) Electrophysiologist:  None  Click to update primary MD,subspecialty MD or APP then REFRESH:1}    History of Present Illness: .   Amanda Bentley is a 68 y.o. Caucasian female whose past medical history and cardiovascular risk factors includes: Paroxysmal supraventricular tachycardia, LBBB, hypertension, prediabetes, osteoarthritis, carpal tunnel syndrome, postmenopausal female, advanced age, obesity due to excess calories.   Patient was referred to practice for evaluation and management of dyspnea on exertion and palpitations.  Her dyspnea on exertion is multifactorial and she has had a very thorough cardiovascular workup.  She was also started on steroids in the recent past due to concerns for temporal arteritis.  Due to continued palpitations she had a repeat monitor in May 2025 which noted average heart rate of 83 bpm, tachycardia burden of 13%, and episodes of PSVT which at times correlated to patient triggered events.  She was referred to EP and was seen by Dr. Waddell since last office visit.  According to Dr. Adrian last progress note he recommends conservative management as she does not have sustained arrhythmia.   Today she denies any anginal chest pain or heart failure symptoms. She has lower extremity swelling which is chronic and is likely progressive. Went to the ED due to pain and had a lower extremity venous duplex which was negative for DVT on the right side. She was treated for cellulitis with doxycycline  and was unable to tolerate it and therefore PCP transitioned her to Augmentin. She remains on oral steroids which may be contributing to her lower extremity swelling. She is currently taking  torsemide  20 mg Monday Wednesday and Friday, chooses not to be on a daily use due to hypokalemia.  She is already on potassium supplements  Review of Systems: .   Review of Systems  Cardiovascular:  Positive for leg swelling. Negative for chest pain, claudication, irregular heartbeat, near-syncope, orthopnea, palpitations, paroxysmal nocturnal dyspnea and syncope.  Respiratory:  Negative for shortness of breath.   Hematologic/Lymphatic: Negative for bleeding problem.    Studies Reviewed:    Echocardiogram: 06/11/2023 1. Left ventricular ejection fraction, by estimation, is 55 to 60%. Left ventricular ejection fraction by 3D volume is 58 %. The left ventricle has normal function. The left ventricle has no regional wall motion abnormalities. There is moderate left ventricular hypertrophy. Left ventricular diastolic parameters are consistent with Grade I diastolic dysfunction (impaired relaxation). 2. Right ventricular systolic function is normal. The right ventricular size is normal. There is normal pulmonary artery systolic pressure. The estimated right ventricular systolic pressure is 27.7 mmHg. 3. The mitral valve is grossly normal. Mild mitral valve regurgitation. 4. The aortic valve is tricuspid. Aortic valve regurgitation is not visualized. 5. The inferior vena cava is dilated in size with >50% respiratory variability, suggesting right atrial pressure of 8 mmHg.   Coronary calcium score 10/11/2021: 1. No coronary atherosclerotic calcifications. The observed calcium score of 0 is at the zeroth percentile for subjects of the same age, gender, and race/ethnicity who are free of clinical cardiovascular disease and treated diabetes. 2.  Aortic Atherosclerosis (ICD10-I70.0).  Cardiac monitor: Cardiac monitor (Zio Patch): 09/27/2022 - 10/11/2022 Dominant rhythm sinus. Heart rate 54-222.  Bpm.  Avg HR 82 bpm. No atrial fibrillation detected  during the monitoring period. No ventricular  tachycardia, high grade AV block, pauses (3 seconds or longer). Total supraventricular ectopic burden <1%. Frequent episodes of PSVT noted during the monitoring. Total ventricular ectopic burden <1%. Patient triggered events: 8.   Underlying episodes demonstrated either sinus rhythm or sinus tachycardia with intermittent episodes of PSVT. Patient triggered event on 10/01/2022 at 4:32 PM illustrated sinus rhythm with 15 beat of PSVT, 4.8 seconds in duration, max HR of 222 bpm and average HR of 96 bpm  Risk Assessment/Calculations:   N/A   Labs:       Latest Ref Rng & Units 07/17/2023    7:15 PM 06/28/2023    2:41 PM 01/30/2023    4:45 PM  CBC  WBC 4.0 - 10.5 K/uL 11.3  11.1  19.3   Hemoglobin 12.0 - 15.0 g/dL 85.5  85.6  86.5   Hematocrit 36.0 - 46.0 % 45.0  42.6  38.6   Platelets 150 - 400 K/uL 260  282  352        Latest Ref Rng & Units 07/26/2023   11:08 AM 07/17/2023    7:15 PM 07/11/2023    3:04 PM  BMP  Glucose 70 - 99 mg/dL 882  867  878   BUN 8 - 27 mg/dL 16  18  12    Creatinine 0.57 - 1.00 mg/dL 9.11  9.10  9.19   BUN/Creat Ratio 12 - 28 18   15    Sodium 134 - 144 mmol/L 146  141  141   Potassium 3.5 - 5.2 mmol/L 3.5  3.6  3.4   Chloride 96 - 106 mmol/L 104  105  101   CO2 20 - 29 mmol/L 19  23  21    Calcium 8.7 - 10.3 mg/dL 9.6  9.1  9.3       Latest Ref Rng & Units 07/26/2023   11:08 AM 07/17/2023    7:15 PM 07/11/2023    3:04 PM  CMP  Glucose 70 - 99 mg/dL 882  867  878   BUN 8 - 27 mg/dL 16  18  12    Creatinine 0.57 - 1.00 mg/dL 9.11  9.10  9.19   Sodium 134 - 144 mmol/L 146  141  141   Potassium 3.5 - 5.2 mmol/L 3.5  3.6  3.4   Chloride 96 - 106 mmol/L 104  105  101   CO2 20 - 29 mmol/L 19  23  21    Calcium 8.7 - 10.3 mg/dL 9.6  9.1  9.3     No results found for: CHOL, HDL, LDLCALC, LDLDIRECT, TRIG, CHOLHDL No results for input(s): LIPOA in the last 8760 hours. No components found for: NTPROBNP Recent Labs    05/08/23 1528  PROBNP 376*    Recent Labs    06/28/23 1441  TSH 2.980     Physical Exam:    Today's Vitals   08/15/23 0812  BP: (!) 154/90  Pulse: 74  Resp: 16  SpO2: 96%  Weight: 254 lb 11.2 oz (115.5 kg)  Height: 5' 6 (1.676 m)    Body mass index is 41.11 kg/m. Wt Readings from Last 3 Encounters:  08/15/23 254 lb 11.2 oz (115.5 kg)  07/17/23 240 lb (108.9 kg)  07/05/23 249 lb (112.9 kg)    Physical Exam  Constitutional: No distress.  Age appropriate, hemodynamically stable.   Neck: No JVD present.  Cardiovascular: Normal rate, regular rhythm, S1 normal, S2 normal, intact distal pulses and normal pulses.  Exam reveals no gallop, no S3 and no S4.  No murmur heard. Pulmonary/Chest: Effort normal. No stridor. She has no wheezes. She has no rales. She exhibits no tenderness.  Abdominal: Soft. Bowel sounds are normal. She exhibits no distension. There is no abdominal tenderness.  Musculoskeletal:        General: Edema (+1 bilateral up to midshin, redness, ace wrap over the right ankle.) present.     Cervical back: Neck supple.  Neurological: She is alert and oriented to person, place, and time. She has intact cranial nerves (2-12).  Skin: Skin is warm and moist.   Impression & Recommendation(s):  Impression:   ICD-10-CM   1. Shortness of breath  R06.02     2. Palpitations  R00.2 Magnesium    Basic metabolic panel with GFR    Basic metabolic panel with GFR    Magnesium    3. PSVT (paroxysmal supraventricular tachycardia) (HCC)  I47.10     4. Benign hypertension  I10 Magnesium    Basic metabolic panel with GFR    torsemide  (DEMADEX ) 10 MG tablet    Basic metabolic panel with GFR    Magnesium    5. LBBB (left bundle branch block)  I44.7     6. Class 3 severe obesity due to excess calories with serious comorbidity and body mass index (BMI) of 40.0 to 44.9 in adult  E66.813    Z68.41      Recommendation(s):  Shortness of breath Chronic and stable. Multifactorial. Most recent echo  from May 2025 notes preserved LVEF, grade 1 diastolic dysfunction, right ventricular size and function are normal, no significant valvular heart disease, estimated RAP 8 mmHg. Likely has fluid retention secondary to chronic steroid use. Currently on torsemide  20 mg Monday Wednesday and Friday. Recommended daily use of torsemide  but patient states that she gets hypokalemic and chooses not to. Shared decision was to reduce the torsemide  to 10 mg p.o. daily BMP and magnesium in one week to check renal function and electrolytes Remain on the current dose of potassium supplementation  Palpitations PSVT (paroxysmal supraventricular tachycardia) (HCC) Currently on metoprolol . Symptoms are well-controlled. Has been seen by EP in the past and no plans for catheter directed ablation at this time  Benign hypertension Office blood pressures are not at goal. Home blood pressures are better controlled. Medications reconciled  LBBB (left bundle branch block) Chronic and stable  Class 3 severe obesity due to excess calories with serious comorbidity and body mass index (BMI) of 40.0 to 44.9 in adult Body mass index is 41.11 kg/m. I reviewed with her importance of diet, regular physical activity/exercise, weight loss.   Patient is educated on the importance of increasing physical activity gradually as tolerated with a goal of moderate intensity exercise for 30 minutes a day 5 days a week.  Orders Placed:  Orders Placed This Encounter  Procedures   Magnesium    Standing Status:   Future    Number of Occurrences:   1    Expected Date:   08/22/2023    Expiration Date:   08/14/2024   Basic metabolic panel with GFR    Standing Status:   Future    Number of Occurrences:   1    Expected Date:   08/22/2023    Expiration Date:   08/14/2024   Final Medication List:    Meds ordered this encounter  Medications   torsemide  (DEMADEX ) 10 MG tablet    Sig: Take 1 tablet (10 mg total)  by mouth daily.     Dispense:  90 tablet    Refill:  3    Medications Discontinued During This Encounter  Medication Reason   doxycycline  (VIBRAMYCIN ) 100 MG capsule Change in therapy   glipiZIDE (GLUCOTROL XL) 5 MG 24 hr tablet Patient Preference   torsemide  (DEMADEX ) 20 MG tablet Reorder      Current Outpatient Medications:    ALPRAZolam (XANAX) 0.5 MG tablet, Take 0.5 mg by mouth 2 (two) times daily as needed for anxiety., Disp: , Rfl:    amoxicillin-clavulanate (AUGMENTIN) 875-125 MG tablet, Take 1 tablet by mouth 2 (two) times daily., Disp: , Rfl:    Cholecalciferol (VITAMIN D) 50 MCG (2000 UT) tablet, Take 2,000 Units by mouth daily., Disp: , Rfl:    methylPREDNISolone  (MEDROL ) 4 MG tablet, Take 1. 75 tablets by mouth daily for 30 days,  then 1.5 tablet daily for 30 days, then 1.25 tablets for 30 days, Disp: , Rfl:    metoprolol  tartrate (LOPRESSOR ) 50 MG tablet, Take 1 tablet (50 mg total) by mouth 2 (two) times daily., Disp: 180 tablet, Rfl: 3   oxyCODONE  (ROXICODONE ) 5 MG immediate release tablet, Take 1 tablet (5 mg total) by mouth every 6 (six) hours as needed for up to 10 doses., Disp: 10 tablet, Rfl: 0   potassium chloride  SA (KLOR-CON  M20) 20 MEQ tablet, Take 2 tablets (40 mEq total) by mouth 2 (two) times daily., Disp: 120 tablet, Rfl: 1   spironolactone  (ALDACTONE ) 50 MG tablet, Take 1 tablet (50 mg total) by mouth daily., Disp: 90 tablet, Rfl: 3   torsemide  (DEMADEX ) 10 MG tablet, Take 1 tablet (10 mg total) by mouth daily., Disp: 90 tablet, Rfl: 3  Consent:      NA  Disposition:   6 months follow-up with Dayna Dunn, who was seen in the past. 12 months follow-up with myself sooner if needed.  Her questions and concerns were addressed to her satisfaction. She voices understanding of the recommendations provided during this encounter.    Signed, Madonna Michele HAS, [facility]  [facility]  A Division of [facility] [address] [place], [place] [address]   [place], [place] [id number] 08/15/2023 8:52 AM

## 2023-08-15 NOTE — Patient Instructions (Signed)
 Medication Instructions:  CHANGE Torsemide  to 10 mg take one tablet daily   *If you need a refill on your cardiac medications before your next appointment, please call your pharmacy*  Lab Work IN 1 WEEK: BMP MAGNESIUM   If you have labs (blood work) drawn today and your tests are completely normal, you will receive your results only by: MyChart Message (if you have MyChart) OR A paper copy in the mail If you have any lab test that is abnormal or we need to change your treatment, we will call you to review the results.   Follow-Up: At [facility], you and your health needs are our priority.  As part of our continuing mission to provide you with exceptional heart care, our providers are all part of one team.  This team includes your primary Cardiologist (physician) and Advanced Practice Providers or APPs (Physician Assistants and Nurse Practitioners) who all work together to provide you with the care you need, when you need it.  Your next appointment:   6 month(s)  Provider:   Dayna Dunn, PA-C      Then, Madonna Large, DO will plan to see you again in 1 year(s).    We recommend signing up for the patient portal called MyChart.  Sign up information is provided on this After Visit Summary.  MyChart is used to connect with patients for Virtual Visits (Telemedicine).  Patients are able to view lab/test results, encounter notes, upcoming appointments, etc.  Non-urgent messages can be sent to your provider as well.   To learn more about what you can do with MyChart, go to [email or web address].

## 2023-08-16 DIAGNOSIS — E1165 Type 2 diabetes mellitus with hyperglycemia: Secondary | ICD-10-CM | POA: Diagnosis not present

## 2023-08-21 DIAGNOSIS — M316 Other giant cell arteritis: Secondary | ICD-10-CM | POA: Diagnosis not present

## 2023-08-21 DIAGNOSIS — R609 Edema, unspecified: Secondary | ICD-10-CM | POA: Diagnosis not present

## 2023-08-21 DIAGNOSIS — Z7952 Long term (current) use of systemic steroids: Secondary | ICD-10-CM | POA: Diagnosis not present

## 2023-08-21 DIAGNOSIS — L650 Telogen effluvium: Secondary | ICD-10-CM | POA: Diagnosis not present

## 2023-08-26 ENCOUNTER — Ambulatory Visit

## 2023-08-26 DIAGNOSIS — R002 Palpitations: Secondary | ICD-10-CM | POA: Diagnosis not present

## 2023-08-26 DIAGNOSIS — I10 Essential (primary) hypertension: Secondary | ICD-10-CM | POA: Diagnosis not present

## 2023-08-27 ENCOUNTER — Ambulatory Visit: Payer: Self-pay

## 2023-08-27 LAB — BASIC METABOLIC PANEL WITH GFR
BUN/Creatinine Ratio: 18 (ref 12–28)
BUN: 14 mg/dL (ref 8–27)
CO2: 21 mmol/L (ref 20–29)
Calcium: 9.3 mg/dL (ref 8.7–10.3)
Chloride: 102 mmol/L (ref 96–106)
Creatinine, Ser: 0.79 mg/dL (ref 0.57–1.00)
Glucose: 92 mg/dL (ref 70–99)
Potassium: 3.6 mmol/L (ref 3.5–5.2)
Sodium: 142 mmol/L (ref 134–144)
eGFR: 81 mL/min/1.73 (ref >59)

## 2023-08-27 LAB — MAGNESIUM: Magnesium: 1.9 mg/dL (ref 1.6–2.3)

## 2023-08-28 DIAGNOSIS — R609 Edema, unspecified: Secondary | ICD-10-CM | POA: Diagnosis not present

## 2023-09-03 ENCOUNTER — Ambulatory Visit
Admission: RE | Admit: 2023-09-03 | Discharge: 2023-09-03 | Disposition: A | Discharge status: Home / Self Care | Source: Ambulatory Visit | Attending: Family Medicine | Admitting: Family Medicine

## 2023-09-03 DIAGNOSIS — Z1231 Encounter for screening mammogram for malignant neoplasm of breast: Secondary | ICD-10-CM

## 2023-09-11 DIAGNOSIS — I890 Lymphedema, not elsewhere classified: Secondary | ICD-10-CM | POA: Diagnosis not present

## 2023-09-18 DIAGNOSIS — I890 Lymphedema, not elsewhere classified: Secondary | ICD-10-CM | POA: Diagnosis not present

## 2023-09-25 DIAGNOSIS — I890 Lymphedema, not elsewhere classified: Secondary | ICD-10-CM | POA: Diagnosis not present

## 2023-09-26 DIAGNOSIS — E7800 Pure hypercholesterolemia, unspecified: Secondary | ICD-10-CM | POA: Diagnosis not present

## 2023-09-26 DIAGNOSIS — I10 Essential (primary) hypertension: Secondary | ICD-10-CM | POA: Diagnosis not present

## 2023-09-26 DIAGNOSIS — E559 Vitamin D deficiency, unspecified: Secondary | ICD-10-CM | POA: Diagnosis not present

## 2023-09-26 DIAGNOSIS — D510 Vitamin B12 deficiency anemia due to intrinsic factor deficiency: Secondary | ICD-10-CM | POA: Diagnosis not present

## 2023-09-30 DIAGNOSIS — R7303 Prediabetes: Secondary | ICD-10-CM | POA: Diagnosis not present

## 2023-09-30 DIAGNOSIS — D696 Thrombocytopenia, unspecified: Secondary | ICD-10-CM | POA: Diagnosis not present

## 2023-10-01 DIAGNOSIS — M316 Other giant cell arteritis: Secondary | ICD-10-CM | POA: Diagnosis not present

## 2023-10-01 DIAGNOSIS — R5383 Other fatigue: Secondary | ICD-10-CM | POA: Diagnosis not present

## 2023-10-01 DIAGNOSIS — Z79899 Other long term (current) drug therapy: Secondary | ICD-10-CM | POA: Diagnosis not present

## 2023-10-01 DIAGNOSIS — M353 Polymyalgia rheumatica: Secondary | ICD-10-CM | POA: Diagnosis not present

## 2023-10-02 DIAGNOSIS — Z0000 Encounter for general adult medical examination without abnormal findings: Secondary | ICD-10-CM | POA: Diagnosis not present

## 2023-10-02 DIAGNOSIS — R29898 Other symptoms and signs involving the musculoskeletal system: Secondary | ICD-10-CM | POA: Diagnosis not present

## 2023-10-02 DIAGNOSIS — R600 Localized edema: Secondary | ICD-10-CM | POA: Diagnosis not present

## 2023-10-02 DIAGNOSIS — E559 Vitamin D deficiency, unspecified: Secondary | ICD-10-CM | POA: Diagnosis not present

## 2023-10-02 DIAGNOSIS — I10 Essential (primary) hypertension: Secondary | ICD-10-CM | POA: Diagnosis not present

## 2023-10-02 DIAGNOSIS — Z1331 Encounter for screening for depression: Secondary | ICD-10-CM | POA: Diagnosis not present

## 2023-10-02 DIAGNOSIS — D510 Vitamin B12 deficiency anemia due to intrinsic factor deficiency: Secondary | ICD-10-CM | POA: Diagnosis not present

## 2023-10-02 DIAGNOSIS — G43009 Migraine without aura, not intractable, without status migrainosus: Secondary | ICD-10-CM | POA: Diagnosis not present

## 2023-10-02 DIAGNOSIS — E43 Unspecified severe protein-calorie malnutrition: Secondary | ICD-10-CM | POA: Diagnosis not present

## 2023-10-02 DIAGNOSIS — I890 Lymphedema, not elsewhere classified: Secondary | ICD-10-CM | POA: Diagnosis not present

## 2023-10-09 DIAGNOSIS — I890 Lymphedema, not elsewhere classified: Secondary | ICD-10-CM | POA: Diagnosis not present

## 2023-10-11 ENCOUNTER — Telehealth: Payer: Self-pay | Admitting: Internal Medicine

## 2023-10-11 NOTE — Telephone Encounter (Signed)
 During last appointment patient says she was advised to get [facility] app. She was give instructions but she no longer has them. She says she has been having issues with her phone but just got it fixed. Would like to review instructions for [facility] if at all possible.

## 2023-10-11 NOTE — Telephone Encounter (Signed)
 Spoke with pt, discussed how to make PDF and share the ECG to the best of my knowledge.

## 2023-10-15 DIAGNOSIS — E876 Hypokalemia: Secondary | ICD-10-CM | POA: Diagnosis not present

## 2023-10-25 DIAGNOSIS — Z124 Encounter for screening for malignant neoplasm of cervix: Secondary | ICD-10-CM | POA: Diagnosis not present

## 2023-10-25 DIAGNOSIS — Z6841 Body Mass Index (BMI) 40.0 and over, adult: Secondary | ICD-10-CM | POA: Diagnosis not present

## 2023-10-25 DIAGNOSIS — Z01419 Encounter for gynecological examination (general) (routine) without abnormal findings: Secondary | ICD-10-CM | POA: Diagnosis not present

## 2023-11-12 DIAGNOSIS — E1165 Type 2 diabetes mellitus with hyperglycemia: Secondary | ICD-10-CM | POA: Diagnosis not present

## 2023-11-12 DIAGNOSIS — M316 Other giant cell arteritis: Secondary | ICD-10-CM | POA: Diagnosis not present

## 2023-11-12 DIAGNOSIS — R739 Hyperglycemia, unspecified: Secondary | ICD-10-CM | POA: Diagnosis not present

## 2023-11-12 DIAGNOSIS — I10 Essential (primary) hypertension: Secondary | ICD-10-CM | POA: Diagnosis not present

## 2023-11-20 DIAGNOSIS — R609 Edema, unspecified: Secondary | ICD-10-CM | POA: Diagnosis not present

## 2023-11-20 DIAGNOSIS — R42 Dizziness and giddiness: Secondary | ICD-10-CM | POA: Diagnosis not present

## 2023-11-27 DIAGNOSIS — I10 Essential (primary) hypertension: Secondary | ICD-10-CM | POA: Diagnosis not present

## 2023-11-27 DIAGNOSIS — R5383 Other fatigue: Secondary | ICD-10-CM | POA: Diagnosis not present

## 2023-11-27 DIAGNOSIS — M79604 Pain in right leg: Secondary | ICD-10-CM | POA: Diagnosis not present

## 2023-11-28 DIAGNOSIS — I890 Lymphedema, not elsewhere classified: Secondary | ICD-10-CM | POA: Diagnosis not present

## 2023-12-05 DIAGNOSIS — R1030 Lower abdominal pain, unspecified: Secondary | ICD-10-CM | POA: Diagnosis not present

## 2023-12-05 DIAGNOSIS — K219 Gastro-esophageal reflux disease without esophagitis: Secondary | ICD-10-CM | POA: Diagnosis not present

## 2023-12-05 DIAGNOSIS — R194 Change in bowel habit: Secondary | ICD-10-CM | POA: Diagnosis not present

## 2023-12-05 DIAGNOSIS — R1012 Left upper quadrant pain: Secondary | ICD-10-CM | POA: Diagnosis not present

## 2023-12-17 DIAGNOSIS — G4700 Insomnia, unspecified: Secondary | ICD-10-CM | POA: Diagnosis not present

## 2023-12-17 DIAGNOSIS — G43009 Migraine without aura, not intractable, without status migrainosus: Secondary | ICD-10-CM | POA: Diagnosis not present

## 2023-12-17 DIAGNOSIS — I10 Essential (primary) hypertension: Secondary | ICD-10-CM | POA: Diagnosis not present

## 2023-12-17 DIAGNOSIS — E669 Obesity, unspecified: Secondary | ICD-10-CM | POA: Diagnosis not present

## 2023-12-25 ENCOUNTER — Encounter: Payer: Self-pay | Admitting: Physician Assistant

## 2023-12-31 DIAGNOSIS — K219 Gastro-esophageal reflux disease without esophagitis: Secondary | ICD-10-CM | POA: Diagnosis not present

## 2023-12-31 DIAGNOSIS — E119 Type 2 diabetes mellitus without complications: Secondary | ICD-10-CM | POA: Diagnosis not present

## 2024-01-01 DIAGNOSIS — M316 Other giant cell arteritis: Secondary | ICD-10-CM | POA: Diagnosis not present

## 2024-01-01 DIAGNOSIS — Z79899 Other long term (current) drug therapy: Secondary | ICD-10-CM | POA: Diagnosis not present

## 2024-01-31 ENCOUNTER — Encounter: Payer: Self-pay | Admitting: Cardiology

## 2024-01-31 ENCOUNTER — Ambulatory Visit: Attending: Cardiology | Admitting: Cardiology

## 2024-01-31 ENCOUNTER — Other Ambulatory Visit (HOSPITAL_COMMUNITY): Payer: Self-pay

## 2024-01-31 VITALS — BP 166/64 | HR 66 | Resp 16 | Ht 66.0 in | Wt 256.6 lb

## 2024-01-31 DIAGNOSIS — R002 Palpitations: Secondary | ICD-10-CM | POA: Diagnosis not present

## 2024-01-31 DIAGNOSIS — I447 Left bundle-branch block, unspecified: Secondary | ICD-10-CM | POA: Diagnosis not present

## 2024-01-31 DIAGNOSIS — Z6841 Body Mass Index (BMI) 40.0 and over, adult: Secondary | ICD-10-CM

## 2024-01-31 DIAGNOSIS — E66813 Obesity, class 3: Secondary | ICD-10-CM | POA: Diagnosis not present

## 2024-01-31 DIAGNOSIS — R0602 Shortness of breath: Secondary | ICD-10-CM | POA: Diagnosis not present

## 2024-01-31 DIAGNOSIS — I10 Essential (primary) hypertension: Secondary | ICD-10-CM

## 2024-01-31 DIAGNOSIS — I471 Supraventricular tachycardia, unspecified: Secondary | ICD-10-CM

## 2024-01-31 MED ORDER — SPIRONOLACTONE 25 MG PO TABS
25.0000 mg | ORAL_TABLET | Freq: Every day | ORAL | 3 refills | Status: AC
  Filled 2024-01-31: qty 90, 90d supply, fill #0

## 2024-01-31 NOTE — Patient Instructions (Addendum)
 Medication Instructions:  CHANGE Spironolactone  (Aldactone ) from 50 mg to 25 mg. Take one (1) 25 mg tablet by mouth once daily in the morning. *If you need a refill on your cardiac medications before your next appointment, please call your pharmacy*  Lab Work: BMP in one week If you have labs (blood work) drawn today and your tests are completely normal, you will receive your results only by: MyChart Message (if you have MyChart) OR A paper copy in the mail If you have any lab test that is abnormal or we need to change your treatment, we will call you to review the results.  Testing/Procedures: [facility] Myocardial Perfusion Imaging  Follow-Up: At [facility], you and your health needs are our priority.  As part of our continuing mission to provide you with exceptional heart care, our providers are all part of one team.  This team includes your primary Cardiologist (physician) and Advanced Practice Providers or APPs (Physician Assistants and Nurse Practitioners) who all work together to provide you with the care you need, when you need it.  Your next appointment:   6 month(s)  Provider:   Madonna Large, DO    We recommend signing up for the patient portal called MyChart.  Sign up information is provided on this After Visit Summary.  MyChart is used to connect with patients for Virtual Visits (Telemedicine).  Patients are able to view lab/test results, encounter notes, upcoming appointments, etc.  Non-urgent messages can be sent to your provider as well.   To learn more about what you can do with MyChart, go to [email or web address].   Other Instructions    You are scheduled for a Myocardial Perfusion Imaging Study on:  ___ at ___.  Please arrive 15 minutes prior to your appointment time for registration and insurance purposes.  The test will take approximately 3 to 4 hours to complete; you may bring reading material.  If someone comes with you to your appointment, they will  need to remain in the main lobby due to limited space in the testing area. **If you are pregnant or breastfeeding, please notify the nuclear lab prior to your appointment**  How to prepare for your Myocardial Perfusion Test: Do not eat or drink 3 hours prior to your test, except you may have water. Do not consume products containing caffeine (regular or decaffeinated) 12 hours prior to your test. (ex: coffee, chocolate, sodas, tea). Do bring a list of your current medications with you.  If not listed below, you may take your medications as normal. Do wear comfortable clothes (no dresses or overalls) and walking shoes, tennis shoes preferred (No heels or open toe shoes are allowed). Do NOT wear cologne, perfume, aftershave, or lotions (deodorant is allowed). If these instructions are not followed, your test will have to be rescheduled.    PLEASE FOLLOW-UP WITH YOUR PCP REGARDING A PULMONARY CONSULT AND BLOOD PRESSURE MANAGEMENT.

## 2024-01-31 NOTE — Progress Notes (Signed)
 " Cardiology Office Note:  .    ID:  Amanda Bentley, DOB 01/11/1956, MRN [id number] PCP:  Claudene Pellet, MD  Former Cardiology Providers: Elspeth JAYSON Kitten, MD  [facility] Providers Cardiologist:  Madonna Large, DO , [facility] (established care 07/27/2020) Electrophysiologist:  None  Click to update primary MD,subspecialty MD or APP then REFRESH:1}    History of Present Illness: .   Amanda Bentley is a 69 y.o. Caucasian female whose past medical history and cardiovascular risk factors includes: Paroxysmal supraventricular tachycardia, LBBB, hypertension, prediabetes, osteoarthritis, carpal tunnel syndrome, postmenopausal female, advanced age, obesity due to excess calories.   Patient was referred to practice for evaluation and management of dyspnea on exertion and palpitations.  Her dyspnea on exertion is multifactorial and she has had a very thorough cardiovascular workup.  In the past patient was on steroids for concerns for temporal arteritis.  Due to continued palpitations she had a repeat monitor in May 2025 which noted average heart rate of 83 bpm, tachycardia burden of 13%, and episodes of PSVT which at times correlated to patient triggered events.  She was referred to EP and was seen by Dr. Waddell who recommended conservative management as she does not have sustained arrhythmia.   At last office visit patient was taking torsemide  20 mg Monday Wednesday and Friday and advised her to take torsemide  10 mg daily for better fluid management since she was on steroids.  She had follow-up labs in August 2025 and her creatinine and potassium were within normal limits.  History of Present Illness   Earlier this week she had an episode of high BP readings. It was 250/125 mmHg at the fire department, on recheck it was  220/125 mmHg after 15-20 minutes. She did not go to the ER. At a PCP visit later that day her blood pressure was 140/83 mmHg. At home, recent readings are around 141/77 mmHg  with pulse about 80 bpm. During the hypertensive episode her pulse dropped into the 60s, which is unusual for her.  She started olmesartan about 1.5 weeks ago. She takes torsemide  10 mg every morning. She is no longer on spironolactone . She is on metoprolol  50 mg twice daily. Her potassium has remained stable since increasing torsemide  to daily use.  She has chronic shortness of breath, which persists. She fell about a month ago and injured her left rib area. X-rays showed no fracture but some shadowing that is under PCP evaluation. No recent stress test.   She is taking 4 mg of steroids. She had gross hematuria several weeks ago, which is under PCP evaluation. She reports an allergic reaction to IV contrast dye in the past with swelling and breathing difficulty.      Review of Systems: .   Review of Systems  Cardiovascular:  Positive for leg swelling. Negative for chest pain, claudication, irregular heartbeat, near-syncope, orthopnea, palpitations, paroxysmal nocturnal dyspnea and syncope.  Respiratory:  Negative for shortness of breath.   Hematologic/Lymphatic: Negative for bleeding problem.   Studies Reviewed:    Echocardiogram: 06/11/2023 1. Left ventricular ejection fraction, by estimation, is 55 to 60%. Left ventricular ejection fraction by 3D volume is 58 %. The left ventricle has normal function. The left ventricle has no regional wall motion abnormalities. There is moderate left ventricular hypertrophy. Left ventricular diastolic parameters are consistent with Grade I diastolic dysfunction (impaired relaxation). 2. Right ventricular systolic function is normal. The right ventricular size is normal. There is normal pulmonary artery systolic pressure. The  estimated right ventricular systolic pressure is 27.7 mmHg. 3. The mitral valve is grossly normal. Mild mitral valve regurgitation. 4. The aortic valve is tricuspid. Aortic valve regurgitation is not visualized. 5. The inferior vena  cava is dilated in size with >50% respiratory variability, suggesting right atrial pressure of 8 mmHg.   Coronary calcium score 10/11/2021: 1. No coronary atherosclerotic calcifications. The observed calcium score of 0 is at the zeroth percentile for subjects of the same age, gender, and race/ethnicity who are free of clinical cardiovascular disease and treated diabetes. 2.  Aortic Atherosclerosis (ICD10-I70.0).  Cardiac monitor: Cardiac monitor (Zio Patch): 09/27/2022 - 10/11/2022 Dominant rhythm sinus. Heart rate 54-222.  Bpm.  Avg HR 82 bpm. No atrial fibrillation detected during the monitoring period. No ventricular tachycardia, high grade AV block, pauses (3 seconds or longer). Total supraventricular ectopic burden <1%. Frequent episodes of PSVT noted during the monitoring. Total ventricular ectopic burden <1%. Patient triggered events: 8.   Underlying episodes demonstrated either sinus rhythm or sinus tachycardia with intermittent episodes of PSVT. Patient triggered event on 10/01/2022 at 4:32 PM illustrated sinus rhythm with 15 beat of PSVT, 4.8 seconds in duration, max HR of 222 bpm and average HR of 96 bpm  Risk Assessment/Calculations:   N/A   Labs:       Latest Ref Rng & Units 07/17/2023    7:15 PM 06/28/2023    2:41 PM 01/30/2023    4:45 PM  CBC  WBC 4.0 - 10.5 K/uL 11.3  11.1  19.3   Hemoglobin 12.0 - 15.0 g/dL 85.5  85.6  86.5   Hematocrit 36.0 - 46.0 % 45.0  42.6  38.6   Platelets 150 - 400 K/uL 260  282  352        Latest Ref Rng & Units 08/26/2023    3:33 PM 07/26/2023   11:08 AM 07/17/2023    7:15 PM  BMP  Glucose 70 - 99 mg/dL 92  882  867   BUN 8 - 27 mg/dL 14  16  18    Creatinine 0.57 - 1.00 mg/dL 9.20  9.11  9.10   BUN/Creat Ratio 12 - 28 18  18     Sodium 134 - 144 mmol/L 142  146  141   Potassium 3.5 - 5.2 mmol/L 3.6  3.5  3.6   Chloride 96 - 106 mmol/L 102  104  105   CO2 20 - 29 mmol/L 21  19  23    Calcium 8.7 - 10.3 mg/dL 9.3  9.6  9.1        Latest Ref Rng & Units 08/26/2023    3:33 PM 07/26/2023   11:08 AM 07/17/2023    7:15 PM  CMP  Glucose 70 - 99 mg/dL 92  882  867   BUN 8 - 27 mg/dL 14  16  18    Creatinine 0.57 - 1.00 mg/dL 9.20  9.11  9.10   Sodium 134 - 144 mmol/L 142  146  141   Potassium 3.5 - 5.2 mmol/L 3.6  3.5  3.6   Chloride 96 - 106 mmol/L 102  104  105   CO2 20 - 29 mmol/L 21  19  23    Calcium 8.7 - 10.3 mg/dL 9.3  9.6  9.1     No results found for: CHOL, HDL, LDLCALC, LDLDIRECT, TRIG, CHOLHDL No results for input(s): LIPOA in the last 8760 hours. No components found for: NTPROBNP Recent Labs    05/08/23 1528  PROBNP 376*  Recent Labs    06/28/23 1441  TSH 2.980     Physical Exam:    Today's Vitals   01/31/24 0855  BP: (!) 166/64  Pulse: 66  Resp: 16  SpO2: 96%  Weight: 256 lb 9.6 oz (116.4 kg)  Height: 5' 6 (1.676 m)    Body mass index is 41.42 kg/m. Wt Readings from Last 3 Encounters:  01/31/24 256 lb 9.6 oz (116.4 kg)  08/15/23 254 lb 11.2 oz (115.5 kg)  07/17/23 240 lb (108.9 kg)    Physical Exam  Constitutional: No distress.  Age appropriate, hemodynamically stable.   Neck: No JVD present.  Cardiovascular: Normal rate, regular rhythm, S1 normal, S2 normal, intact distal pulses and normal pulses. Exam reveals no gallop, no S3 and no S4.  No murmur heard. Pulmonary/Chest: Effort normal. No stridor. She has no wheezes. She has no rales. She exhibits no tenderness.  Abdominal: Soft. Bowel sounds are normal. She exhibits no distension. There is no abdominal tenderness.  Musculoskeletal:        General: Edema (+1 bilateral up to midshin.) present.     Cervical back: Neck supple.  Neurological: She is alert and oriented to person, place, and time. She has intact cranial nerves (2-12).  Skin: Skin is warm and moist.   Impression & Recommendation(s):  Impression: 1. Shortness of breath   2. Palpitations   3. PSVT (paroxysmal supraventricular tachycardia)   4.  Benign hypertension   5. LBBB (left bundle branch block)   6. Class 3 severe obesity due to excess calories with serious comorbidity and body mass index (BMI) of 40.0 to 44.9 in adult [facility])      Recommendations:   Chronic shortness of breath Multifactorial etiology: uncontrolled hypertension, obesity, potential pulmonary and cardiac causes. Echocardiogram normal, coronary calcium score zero. Allergic to contrast dye limits coronary CTA. - Ordered Lexiscan stress test to evaluate for reversible ischemia. - Discuss with PCP about considering pulmonary consult and further non-cardiac causes of dyspnea.  Paroxysmal supraventricular tachycardia Palpitations Managed with metoprolol  tartrate 50 mg bid. Previous electrophysiology evaluation recommended conservative management. - Continue metoprolol  tartrate 50 mg po bid.  Benign hypertension Episodes of significantly elevated blood pressure, averaging 140 mmHg. On Olmesartan and torsemide . Spironolactone  to be restarted. Discussed risks of sustained high systolic pressure. - Restarted spironolactone  25 mg POQ AM. - Continue Olmesartan 40 mg POQ PM. - Continue torsemide  10 mg POQ AM. - Follow up with PCP for blood pressure management. - Ordered blood work in one week to check kidney function and potassium levels.  Left bundle branch block Pharmacologic stress test planned due to inability to exercise. - Ordered Lexiscan stress test.      Orders Placed:  Orders Placed This Encounter  Procedures   Basic Metabolic Panel (BMET)   Cardiac Stress Test: Informed Consent Details: Physician/Practitioner Attestation; Transcribe to consent form and obtain patient signature    Physician/Practitioner attestation of informed consent for procedure/surgical case:   I, the physician/practitioner, attest that I have discussed with the patient the benefits, risks, side effects, alternatives, likelihood of achieving goals and potential problems during recovery  for the procedure that I have provided informed consent.    Procedure:   Lexiscan    Indication/Reason:   SOB, LBBB   MYOCARDIAL PERFUSION IMAGING    Patient weight in lbs:   256    Where [facility] this be performed[facility]    Type of stress:   Lexiscan  Final Medication List:    Meds ordered this encounter  Medications   spironolactone  (ALDACTONE ) 25 MG tablet    Sig: Take 1 tablet (25 mg total) by mouth daily.    Dispense:  90 tablet    Refill:  3    Medications Discontinued During This Encounter  Medication Reason   spironolactone  (ALDACTONE ) 50 MG tablet Dose change       Current Outpatient Medications:    ALPRAZolam (XANAX) 0.5 MG tablet, Take 0.5 mg by mouth 2 (two) times daily as needed for anxiety., Disp: , Rfl:    amoxicillin-clavulanate (AUGMENTIN) 875-125 MG tablet, Take 1 tablet by mouth 2 (two) times daily., Disp: , Rfl:    Cholecalciferol (VITAMIN D) 50 MCG (2000 UT) tablet, Take 2,000 Units by mouth daily., Disp: , Rfl:    methylPREDNISolone  (MEDROL ) 4 MG tablet, Take 1. 75 tablets by mouth daily for 30 days,  then 1.5 tablet daily for 30 days, then 1.25 tablets for 30 days, Disp: , Rfl:    metoprolol  tartrate (LOPRESSOR ) 50 MG tablet, Take 1 tablet (50 mg total) by mouth 2 (two) times daily., Disp: 180 tablet, Rfl: 3   olmesartan (BENICAR) 40 MG tablet, Take 40 mg by mouth daily., Disp: , Rfl:    oxyCODONE  (ROXICODONE ) 5 MG immediate release tablet, Take 1 tablet (5 mg total) by mouth every 6 (six) hours as needed for up to 10 doses., Disp: 10 tablet, Rfl: 0   potassium chloride  SA (KLOR-CON  M20) 20 MEQ tablet, Take 2 tablets (40 mEq total) by mouth 2 (two) times daily., Disp: 120 tablet, Rfl: 1   spironolactone  (ALDACTONE ) 25 MG tablet, Take 1 tablet (25 mg total) by mouth daily., Disp: 90 tablet, Rfl: 3   torsemide  (DEMADEX ) 10 MG tablet, Take 1 tablet (10 mg total) by mouth daily., Disp: 90 tablet, Rfl: 3  Consent:   Informed Consent   Shared  Decision Making/Informed Consent The risks [chest pain, shortness of breath, cardiac arrhythmias, dizziness, blood pressure fluctuations, myocardial infarction, stroke/transient ischemic attack, nausea, vomiting, allergic reaction, radiation exposure, metallic taste sensation and life-threatening complications (estimated to be 1 in 10,000)], benefits (risk stratification, diagnosing coronary artery disease, treatment guidance) and alternatives of a nuclear stress test were discussed in detail with Ms. Cowie and she agrees to proceed.     Disposition:   6 months follow-up with Dayna Dunn, who was seen in the past. 12 months follow-up with myself sooner if needed.  Her questions and concerns were addressed to her satisfaction. She voices understanding of the recommendations provided during this encounter.    Signed, Madonna Michele HAS, [facility]  A Division of [facility] [address], [place], [place] [address]  12:53 PM "

## 2024-02-08 ENCOUNTER — Encounter: Payer: Self-pay | Admitting: Cardiology

## 2024-02-27 ENCOUNTER — Ambulatory Visit (HOSPITAL_COMMUNITY): Admission: RE | Admit: 2024-02-27 | Attending: Cardiology
